# Patient Record
Sex: Female | Born: 1970 | Race: White | Hispanic: No | Marital: Single | Smoking: Current every day smoker
Health system: Southern US, Community
[De-identification: ages and names within clinical notes are randomized; demographics above are authoritative.]

## PROBLEM LIST (undated history)

## (undated) DIAGNOSIS — G43909 Migraine, unspecified, not intractable, without status migrainosus: Secondary | ICD-10-CM

## (undated) DIAGNOSIS — R569 Unspecified convulsions: Secondary | ICD-10-CM

## (undated) DIAGNOSIS — E7800 Pure hypercholesterolemia, unspecified: Secondary | ICD-10-CM

## (undated) DIAGNOSIS — F329 Major depressive disorder, single episode, unspecified: Secondary | ICD-10-CM

## (undated) DIAGNOSIS — C801 Malignant (primary) neoplasm, unspecified: Secondary | ICD-10-CM

## (undated) DIAGNOSIS — I10 Essential (primary) hypertension: Secondary | ICD-10-CM

## (undated) HISTORY — DX: Essential (primary) hypertension: I10

## (undated) HISTORY — DX: Major depressive disorder, single episode, unspecified: F32.9

## (undated) HISTORY — PX: SKIN CANCER EXCISION: SHX779

## (undated) HISTORY — DX: Migraine, unspecified, not intractable, without status migrainosus: G43.909

## (undated) HISTORY — DX: Pure hypercholesterolemia, unspecified: E78.00

## (undated) HISTORY — DX: Unspecified convulsions: R56.9

---

## 2015-02-14 ENCOUNTER — Emergency Department (HOSPITAL_COMMUNITY)
Admission: EM | Admit: 2015-02-14 | Discharge: 2015-02-14 | Disposition: A | Discharge status: Home / Self Care | Payer: Self-pay | Attending: Emergency Medicine | Admitting: Emergency Medicine

## 2015-02-14 ENCOUNTER — Encounter (HOSPITAL_COMMUNITY): Payer: Self-pay | Admitting: *Deleted

## 2015-02-14 DIAGNOSIS — Z85828 Personal history of other malignant neoplasm of skin: Secondary | ICD-10-CM | POA: Insufficient documentation

## 2015-02-14 DIAGNOSIS — M542 Cervicalgia: Secondary | ICD-10-CM | POA: Insufficient documentation

## 2015-02-14 DIAGNOSIS — R590 Localized enlarged lymph nodes: Secondary | ICD-10-CM | POA: Insufficient documentation

## 2015-02-14 DIAGNOSIS — R499 Unspecified voice and resonance disorder: Secondary | ICD-10-CM | POA: Insufficient documentation

## 2015-02-14 DIAGNOSIS — R630 Anorexia: Secondary | ICD-10-CM | POA: Insufficient documentation

## 2015-02-14 DIAGNOSIS — R591 Generalized enlarged lymph nodes: Secondary | ICD-10-CM

## 2015-02-14 DIAGNOSIS — G479 Sleep disorder, unspecified: Secondary | ICD-10-CM | POA: Insufficient documentation

## 2015-02-14 DIAGNOSIS — Z720 Tobacco use: Secondary | ICD-10-CM | POA: Insufficient documentation

## 2015-02-14 HISTORY — DX: Malignant (primary) neoplasm, unspecified: C80.1

## 2015-02-14 MED ORDER — HYDROCODONE-ACETAMINOPHEN 5-325 MG PO TABS
1.0000 | ORAL_TABLET | ORAL | Status: AC
  Administered 2015-02-14: 1 via ORAL
  Filled 2015-02-14: qty 1

## 2015-02-14 MED ORDER — AMOXICILLIN 500 MG PO CAPS: 500.0000 mg | ORAL_CAPSULE | Freq: Three times a day (TID) | ORAL | Status: DC

## 2015-02-14 MED ORDER — TRAMADOL HCL 50 MG PO TABS: 50.0000 mg | ORAL_TABLET | Freq: Four times a day (QID) | ORAL | Status: DC | PRN

## 2015-02-14 NOTE — ED Provider Notes (Signed)
CSN: [id number]     Arrival date & time 02/14/15  1235 History  This chart was scribed for Dorie Rank, MD by Rayna Sexton, ED scribe. This patient was seen in room APA07/[facility]07 and the patient's care was started at 12:52 PM.    Chief Complaint  Patient presents with  . Lymphadenopathy   The history is provided by the patient. No language interpreter was used.    HPI Comments: Lisa Park is a 44 y.o. female who presents to the Emergency Department complaining of mild, reoccurring, left-sided neck pain and swelling with onset 1 week ago. Pt notes associated trouble eating and sleeping, decreased ROM to her neck and a mild voice change. Pt notes the swelling is reoccuring with 1 prior incident about 2 months ago which ultimately subsided. She denies being on any medications, recent trauma, current medical issues or any known drug allergies. She denies any dental pain, HA, rash, fever, difficulty speaking, swallowing or breathing.    Past Medical History  Diagnosis Date  . Cancer     skin   History reviewed. No pertinent past surgical history. History reviewed. No pertinent family history. History  Substance Use Topics  . Smoking status: Current Every Day Smoker -- 1.00 packs/day    Types: Cigarettes  . Smokeless tobacco: Not on file  . Alcohol Use: No   OB History    No data available     Review of Systems  Constitutional: Positive for appetite change. Negative for fever and chills.  HENT: Positive for voice change. Negative for trouble swallowing.   Respiratory: Negative for shortness of breath.   Musculoskeletal: Positive for myalgias, neck pain and neck stiffness.  Skin: Negative for rash.  Neurological: Negative for headaches.  Psychiatric/Behavioral: Positive for sleep disturbance.  All other systems reviewed and are negative.     Allergies  Review of patient's allergies indicates no known allergies.  Home Medications   Prior to Admission medications    Medication Sig Start Date End Date Taking? Authorizing Provider  amoxicillin (AMOXIL) 500 MG capsule Take 1 capsule (500 mg total) by mouth 3 (three) times daily. 02/14/15   Dorie Rank, MD  traMADol (ULTRAM) 50 MG tablet Take 1 tablet (50 mg total) by mouth every 6 (six) hours as needed. 02/14/15   Dorie Rank, MD   BP 139/92 mmHg  Pulse 71  Temp(Src) 97.8 F (36.6 C) (Oral)  Resp 22  Ht 5\' 6"  (1.676 m)  Wt 120 lb (54.432 kg)  BMI 19.38 kg/m2  SpO2 99%  LMP 02/14/2015 Physical Exam  Constitutional: She appears well-developed and well-nourished. No distress.  HENT:  Head: Normocephalic and atraumatic.  Right Ear: External ear normal.  Left Ear: External ear normal.  Mouth/Throat: Oropharynx is clear and moist. No trismus in the jaw. No dental abscesses, uvula swelling or dental caries. No oropharyngeal exudate, posterior oropharyngeal edema, posterior oropharyngeal erythema or tonsillar abscesses.  Eyes: Conjunctivae are normal. Right eye exhibits no discharge. Left eye exhibits no discharge. No scleral icterus.  Neck: Neck supple. No tracheal deviation present.  Cardiovascular: Normal rate.   Pulmonary/Chest: Effort normal. No stridor. No respiratory distress.  Musculoskeletal: She exhibits no edema.  Lymphadenopathy:       Head (right side): No submandibular adenopathy present.       Head (left side): Submandibular adenopathy present.    She has cervical adenopathy.       Left cervical: Superficial cervical adenopathy present.  Neurological: She is alert. Cranial nerve deficit: no gross  deficits.  Skin: Skin is warm and dry. No rash noted.  Psychiatric: She has a normal mood and affect.  Nursing note and vitals reviewed.   ED Course  Procedures  DIAGNOSTIC STUDIES: Oxygen Saturation is 99% on RA, normal by my interpretation.    COORDINATION OF CARE: 12:57 PM Discussed treatment plan with pt at bedside and pt agreed to plan.  Labs Review Labs Reviewed - No data to  display  Imaging Review No results found.   EKG Interpretation None      MDM   Final diagnoses:  Lymphadenopathy of head and neck   No fever.  No meningismus.  Focal ttp submandibular /superior cervical .  Dc home with oral abx.  Ultram for pain.  Monitor for fever, worsening symptoms   I personally performed the services described in this documentation, which was scribed in my presence.  The recorded information has been reviewed and is accurate.    Dorie Rank, MD 02/14/15 1316

## 2015-02-14 NOTE — ED Notes (Signed)
Pt noticed swollen glands on left side of neck x 1 week, states area is painful. Denies difficulty speaking, swallowing, or breathing. Pt states her voice has changed.

## 2015-02-14 NOTE — ED Notes (Signed)
Pt made aware to return if symptoms worsen or if any life threatening symptoms occur.   

## 2015-02-14 NOTE — Discharge Instructions (Signed)
Swollen Lymph Nodes °The lymphatic system filters fluid from around cells. It is like a system of blood vessels. These channels carry lymph instead of blood. The lymphatic system is an important part of the immune (disease fighting) system. When people talk about "swollen glands in the neck," they are usually talking about swollen lymph nodes. The lymph nodes are like the little traps for infection. You and your caregiver may be able to feel lymph nodes, especially swollen nodes, in these common areas: the groin (inguinal area), armpits (axilla), and above the clavicle (supraclavicular). You may also feel them in the neck (cervical) and the back of the head just above the hairline (occipital). °Swollen glands occur when there is any condition in which the body responds with an allergic type of reaction. For instance, the glands in the neck can become swollen from insect bites or any type of minor infection on the head. These are very noticeable in children with only minor problems. Lymph nodes may also become swollen when there is a tumor or problem with the lymphatic system, such as Hodgkin's disease. °TREATMENT  °· Most swollen glands do not require treatment. They can be observed (watched) for a short period of time, if your caregiver feels it is necessary. Most of the time, observation is not necessary. °· Antibiotics (medicines that kill germs) may be prescribed by your caregiver. Your caregiver may prescribe these if he or she feels the swollen glands are due to a bacterial (germ) infection. Antibiotics are not used if the swollen glands are caused by a virus. °HOME CARE INSTRUCTIONS  °· Take medications as directed by your caregiver. Only take over-the-counter or prescription medicines for pain, discomfort, or fever as directed by your caregiver. °SEEK MEDICAL CARE IF:  °· If you begin to run a temperature greater than 102° F (38.9° C), or as your caregiver suggests. °MAKE SURE YOU:  °· Understand these  instructions. °· Will watch your condition. °· Will get help right away if you are not doing well or get worse. °Document Released: 07/23/2002 Document Revised: 10/25/2011 Document Reviewed: 08/02/2005 °ExitCare® Patient Information ©2015 [employer]. This information is not intended to replace advice given to you by your health care provider. Make sure you discuss any questions you have with your health care provider. ° °Lymphadenopathy °Lymphadenopathy means "disease of the lymph glands." But the term is usually used to describe swollen or enlarged lymph glands, also called lymph nodes. These are the bean-shaped organs found in many locations including the neck, underarm, and groin. Lymph glands are part of the immune system, which fights infections in your body. Lymphadenopathy can occur in just one area of the body, such as the neck, or it can be generalized, with lymph node enlargement in several areas. The nodes found in the neck are the most common sites of lymphadenopathy. °CAUSES °When your immune system responds to germs (such as viruses or bacteria ), infection-fighting cells and fluid build up. This causes the glands to grow in size. Usually, this is not something to worry about. Sometimes, the glands themselves can become infected and inflamed. This is called lymphadenitis. °Enlarged lymph nodes can be caused by many diseases: °· Bacterial disease, such as strep throat or a skin infection. °· Viral disease, such as a common cold. °· Other germs, such as Lyme disease, tuberculosis, or sexually transmitted diseases. °· Cancers, such as lymphoma (cancer of the lymphatic system) or leukemia (cancer of the white blood cells). °· Inflammatory diseases such as lupus   or rheumatoid arthritis. °· Reactions to medications. °Many of the diseases above are rare, but important. This is why you should see your caregiver if you have lymphadenopathy. °SYMPTOMS °· Swollen, enlarged lumps in the neck, back of the head,  or other locations. °· Tenderness. °· Warmth or redness of the skin over the lymph nodes. °· Fever. °DIAGNOSIS °Enlarged lymph nodes are often near the source of infection. They can help health care providers diagnose your illness. For instance: °· Swollen lymph nodes around the jaw might be caused by an infection in the mouth. °· Enlarged glands in the neck often signal a throat infection. °· Lymph nodes that are swollen in more than one area often indicate an illness caused by a virus. °Your caregiver will likely know what is causing your lymphadenopathy after listening to your history and examining you. Blood tests, x-rays, or other tests may be needed. If the cause of the enlarged lymph node cannot be found, and it does not go away by itself, then a biopsy may be needed. Your caregiver will discuss this with you. °TREATMENT °Treatment for your enlarged lymph nodes will depend on the cause. Many times the nodes will shrink to normal size by themselves, with no treatment. Antibiotics or other medicines may be needed for infection. Only take over-the-counter or prescription medicines for pain, discomfort, or fever as directed by your caregiver. °HOME CARE INSTRUCTIONS °Swollen lymph glands usually return to normal when the underlying medical condition goes away. If they persist, contact your health-care provider. He/she might prescribe antibiotics or other treatments, depending on the diagnosis. Take any medications exactly as prescribed. Keep any follow-up appointments made to check on the condition of your enlarged nodes. °SEEK MEDICAL CARE IF: °· Swelling lasts for more than two weeks. °· You have symptoms such as weight loss, night sweats, fatigue, or fever that does not go away. °· The lymph nodes are hard, seem fixed to the skin, or are growing rapidly. °· Skin over the lymph nodes is red and inflamed. This could mean there is an infection. °SEEK IMMEDIATE MEDICAL CARE IF: °· Fluid starts leaking from the  area of the enlarged lymph node. °· You develop a fever of 102° F (38.9° C) or greater. °· Severe pain develops (not necessarily at the site of a large lymph node). °· You develop chest pain or shortness of breath. °· You develop worsening abdominal pain. °MAKE SURE YOU: °· Understand these instructions. °· Will watch your condition. °· Will get help right away if you are not doing well or get worse. °Document Released: 05/11/2008 Document Revised: 12/17/2013 Document Reviewed: 05/11/2008 °ExitCare® Patient Information ©2015 [employer]. This information is not intended to replace advice given to you by your health care provider. Make sure you discuss any questions you have with your health care provider. ° °

## 2015-09-17 DIAGNOSIS — Z139 Encounter for screening, unspecified: Secondary | ICD-10-CM

## 2016-03-30 NOTE — Congregational Nurse Program (Signed)
Congregational Nurse Program Note  Date of Encounter: 09/17/2015  Past Medical History: Past Medical History:  Diagnosis Date  . Cancer    skin    Encounter Details:     CNP Questionnaire - 03/30/16 1248      Patient Demographics   Is this a new or existing patient? New   Patient is considered a/an Not Applicable   Race Caucasian/White     Patient Assistance   Location of Patient [place]   Uninsured Patient Yes   Food insecurities addressed Not Applicable   Transportation assistance No   Assistance securing medications No   Educational health offerings Not Applicable     Encounter Details   Was an Emergency Department visit averted? Yes   Does patient have a medical provider? No   Patient referred to [facility]   Was a mental health screening completed? (GAINS tool) No   Does patient have dental issues? Yes   Was a dental referral made? Yes   Does patient have vision issues? No   Does your patient have an abnormal blood pressure today? No   Since previous encounter, have you referred patient for abnormal blood pressure that resulted in a new diagnosis or medication change? No   Does your patient have an abnormal blood glucose today? No   Since previous encounter, have you referred patient for abnormal blood glucose that resulted in a new diagnosis or medication change? No   Was there a life-saving intervention made? Yes    Client 's complaint was abscessed tooth 3 days painful. Client was referred to the [facility].Appointment was made 09/18/2015 10:00. Tarri Fuller.

## 2016-03-30 NOTE — Congregational Nurse Program (Incomplete)
Congregational Nurse Program Note  Date of Encounter: 09/17/2015  Past Medical History: Past Medical History:  Diagnosis Date  . Cancer    skin    Encounter Details:     CNP Questionnaire - 03/30/16 1248      Patient Demographics   Is this a new or existing patient? New   Patient is considered a/an Not Applicable   Race Caucasian/White     Patient Assistance   Location of Patient [place]   Uninsured Patient Yes   Food insecurities addressed Not Applicable   Transportation assistance No   Assistance securing medications No   Educational health offerings Not Applicable     Encounter Details   Was an Emergency Department visit averted? Yes   Does patient have a medical provider? No   Patient referred to [facility]   Was a mental health screening completed? (GAINS tool) No   Does patient have dental issues? Yes   Was a dental referral made? Yes   Does patient have vision issues? No   Does your patient have an abnormal blood pressure today? No   Since previous encounter, have you referred patient for abnormal blood pressure that resulted in a new diagnosis or medication change? No   Does your patient have an abnormal blood glucose today? No   Since previous encounter, have you referred patient for abnormal blood glucose that resulted in a new diagnosis or medication change? No   Was there a life-saving intervention made? Yes     Seen for dental assistance due to abscess tooth, seen in [place], no insurance, referred to [place], [place] [phone]

## 2016-04-15 NOTE — Congregational Nurse Program (Unsigned)
Congregational Nurse Program Note  Date of Encounter: 09/17/2015  Past Medical History: Past Medical History:  Diagnosis Date   Cancer    skin    Encounter Details:     CNP Questionnaire - 03/30/16 1248      Patient Demographics   Is this a new or existing patient? --   Patient is considered a/an --   Race --     Patient Assistance   Location of Patient Assistance --   Uninsured Patient --   Food insecurities addressed --   Transportation assistance --   Assistance securing medications --   Educational health offerings --     Encounter Details   Was an Emergency Department visit averted? --   Does patient have a medical provider? --   Patient referred to --   Was a mental health screening completed? (GAINS tool) --   Does patient have dental issues? --   Was a dental referral made? --   Does patient have vision issues? --   Does your patient have an abnormal blood pressure today? --   Since previous encounter, have you referred patient for abnormal blood pressure that resulted in a new diagnosis or medication change? --   Does your patient have an abnormal blood glucose today? --   Since previous encounter, have you referred patient for abnormal blood glucose that resulted in a new diagnosis or medication change? --   Was there a life-saving intervention made? --     Painful abscessed tooth for 3 days  BP 126/89 Pulse 114.  Appointment secured for [facility] September 18, 2015 at 10:00am. Alta Corning, RN([facility]) [facility] [phone]) [phone]

## 2016-09-03 ENCOUNTER — Emergency Department (HOSPITAL_COMMUNITY)
Admission: EM | Admit: 2016-09-03 | Discharge: 2016-09-03 | Disposition: A | Discharge status: Home / Self Care | Payer: Self-pay | Attending: Emergency Medicine | Admitting: Emergency Medicine

## 2016-09-03 ENCOUNTER — Emergency Department (HOSPITAL_COMMUNITY): Payer: Self-pay

## 2016-09-03 ENCOUNTER — Encounter (HOSPITAL_COMMUNITY): Payer: Self-pay | Admitting: Emergency Medicine

## 2016-09-03 DIAGNOSIS — Z85820 Personal history of malignant melanoma of skin: Secondary | ICD-10-CM | POA: Insufficient documentation

## 2016-09-03 DIAGNOSIS — F17210 Nicotine dependence, cigarettes, uncomplicated: Secondary | ICD-10-CM | POA: Insufficient documentation

## 2016-09-03 DIAGNOSIS — Z791 Long term (current) use of non-steroidal anti-inflammatories (NSAID): Secondary | ICD-10-CM | POA: Insufficient documentation

## 2016-09-03 DIAGNOSIS — Z79899 Other long term (current) drug therapy: Secondary | ICD-10-CM | POA: Insufficient documentation

## 2016-09-03 DIAGNOSIS — J36 Peritonsillar abscess: Secondary | ICD-10-CM | POA: Insufficient documentation

## 2016-09-03 LAB — I-STAT CHEM 8, ED
BUN: 7 mg/dL (ref 6–20)
Calcium, Ion: 1.21 mmol/L (ref 1.15–1.40)
Chloride: 103 mmol/L (ref 101–111)
Creatinine, Ser: 0.8 mg/dL (ref 0.44–1.00)
Glucose, Bld: 108 mg/dL — ABNORMAL HIGH (ref 65–99)
HCT: 46 % (ref 36.0–46.0)
Hemoglobin: 15.6 g/dL — ABNORMAL HIGH (ref 12.0–15.0)
Potassium: 3.3 mmol/L — ABNORMAL LOW (ref 3.5–5.1)
Sodium: 140 mmol/L (ref 135–145)
TCO2: 23 mmol/L (ref 0–100)

## 2016-09-03 LAB — CBC WITH DIFFERENTIAL/PLATELET
Basophils Absolute: 0 10*3/uL (ref 0.0–0.1)
Basophils Relative: 0 %
Eosinophils Absolute: 0.3 10*3/uL (ref 0.0–0.7)
Eosinophils Relative: 2 %
HCT: 45.2 % (ref 36.0–46.0)
Hemoglobin: 15.1 g/dL — ABNORMAL HIGH (ref 12.0–15.0)
Lymphocytes Relative: 13 %
Lymphs Abs: 1.6 10*3/uL (ref 0.7–4.0)
MCH: 31.9 pg (ref 26.0–34.0)
MCHC: 33.4 g/dL (ref 30.0–36.0)
MCV: 95.6 fL (ref 78.0–100.0)
Monocytes Absolute: 1.4 10*3/uL — ABNORMAL HIGH (ref 0.1–1.0)
Monocytes Relative: 12 %
Neutro Abs: 8.6 10*3/uL — ABNORMAL HIGH (ref 1.7–7.7)
Neutrophils Relative %: 73 %
Platelets: 270 10*3/uL (ref 150–400)
RBC: 4.73 MIL/uL (ref 3.87–5.11)
RDW: 13.9 % (ref 11.5–15.5)
WBC: 11.9 10*3/uL — ABNORMAL HIGH (ref 4.0–10.5)

## 2016-09-03 LAB — BASIC METABOLIC PANEL
Anion gap: 10 (ref 5–15)
BUN: 8 mg/dL (ref 6–20)
CO2: 23 mmol/L (ref 22–32)
Calcium: 9 mg/dL (ref 8.9–10.3)
Chloride: 105 mmol/L (ref 101–111)
Creatinine, Ser: 0.74 mg/dL (ref 0.44–1.00)
GFR calc Af Amer: >60 mL/min (ref >60)
GFR calc non Af Amer: >60 mL/min (ref >60)
Glucose, Bld: 106 mg/dL — ABNORMAL HIGH (ref 65–99)
Potassium: 3.3 mmol/L — ABNORMAL LOW (ref 3.5–5.1)
Sodium: 138 mmol/L (ref 135–145)

## 2016-09-03 LAB — RAPID STREP SCREEN (MED CTR MEBANE ONLY): Streptococcus, Group A Screen (Direct): NEGATIVE

## 2016-09-03 MED ORDER — KETOROLAC TROMETHAMINE 30 MG/ML IJ SOLN
30.0000 mg | Freq: Once | INTRAMUSCULAR | Status: AC
  Administered 2016-09-03: 30 mg via INTRAVENOUS
  Filled 2016-09-03: qty 1

## 2016-09-03 MED ORDER — HYDROCODONE-ACETAMINOPHEN 7.5-325 MG/15ML PO SOLN: 15.0000 mL | Freq: Four times a day (QID) | ORAL | 0 refills | Status: AC | PRN

## 2016-09-03 MED ORDER — HYDROCODONE-ACETAMINOPHEN 5-325 MG PO TABS: 1.0000 | ORAL_TABLET | ORAL | 0 refills | Status: DC | PRN

## 2016-09-03 MED ORDER — SODIUM CHLORIDE 0.9 % IV BOLUS (SEPSIS)
2000.0000 mL | Freq: Once | INTRAVENOUS | Status: AC
  Administered 2016-09-03: 2000 mL via INTRAVENOUS

## 2016-09-03 MED ORDER — CLINDAMYCIN PHOSPHATE 600 MG/50ML IV SOLN
600.0000 mg | Freq: Once | INTRAVENOUS | Status: AC
  Administered 2016-09-03: 600 mg via INTRAVENOUS
  Filled 2016-09-03: qty 50

## 2016-09-03 MED ORDER — MORPHINE SULFATE (PF) 4 MG/ML IV SOLN
4.0000 mg | Freq: Once | INTRAVENOUS | Status: AC
  Administered 2016-09-03: 4 mg via INTRAVENOUS
  Filled 2016-09-03: qty 1

## 2016-09-03 MED ORDER — CLINDAMYCIN HCL 300 MG PO CAPS: 300.0000 mg | ORAL_CAPSULE | Freq: Three times a day (TID) | ORAL | 0 refills | Status: DC

## 2016-09-03 MED ORDER — DEXAMETHASONE SODIUM PHOSPHATE 10 MG/ML IJ SOLN
10.0000 mg | Freq: Once | INTRAMUSCULAR | Status: AC
  Administered 2016-09-03: 10 mg via INTRAVENOUS
  Filled 2016-09-03: qty 1

## 2016-09-03 MED ORDER — IOPAMIDOL (ISOVUE-300) INJECTION 61%
100.0000 mL | Freq: Once | INTRAVENOUS | Status: AC | PRN
  Administered 2016-09-03: 75 mL via INTRAVENOUS

## 2016-09-03 NOTE — Discharge Instructions (Signed)
Medications: Clindamycin, Hycet  Treatment: Take clindamycin 3 times daily for 1 week. Make sure to finish all of this medication. Take Hycet 4 times daily as needed for severe pain. Take ibuprofen every 8 hours as needed for mild to moderate pain. Make sure to drink plenty of fluids.  Follow-up: If your symptoms are not improving over the next 72 hours, please call Dr. Victorio Palm office for further evaluation and treatment. Please return to the emergency department if you develop any new or worsening symptoms.

## 2016-09-03 NOTE — ED Provider Notes (Signed)
[facility] DEPT Provider Note   CSN: [id number] Arrival date & time: 09/03/16  1115     History   Chief Complaint Chief Complaint  Patient presents with  . Sore Throat  . Cough    HPI Lisa Park is a 46 y.o. female who presents with a one-week history of sore throat. Patient reports her symptoms began one week ago when she was in [place]. Patient was seen at a hospital in [place] in tested for flu. She does not believe she was tested for strep. He was given an intramuscular antibiotic injection, but has not noted exactly which antibiotic. She was discharged home with ibuprofen, albuterol, and a cough suppressant. Patient denies cough at this time. She reports she has not been able to eat or swallow here and she has pain with opening her mouth. She has also had associated fevers and night sweats since symptom onset. She denies cough, chest pain, shortness of breath, abdominal pain, nausea, vomiting, urinary symptoms.  HPI  Past Medical History:  Diagnosis Date  . Cancer ([place])    skin    There are no active problems to display for this patient.   History reviewed. No pertinent surgical history.  OB History    No data available       Home Medications    Prior to Admission medications   Medication Sig Start Date End Date Taking? Authorizing Provider  albuterol (PROVENTIL HFA;VENTOLIN HFA) 108 (90 Base) MCG/ACT inhaler Inhale 1 puff into the lungs every 6 (six) hours as needed for wheezing or shortness of breath.   Yes Historical Provider, MD  guaiFENesin-codeine (ROBITUSSIN AC) 100-10 MG/5ML syrup Take 5 mLs by mouth 3 (three) times daily as needed for cough.   Yes Historical Provider, MD  ibuprofen (ADVIL,MOTRIN) 800 MG tablet Take 800 mg by mouth every 8 (eight) hours as needed for moderate pain.   Yes Historical Provider, MD  Omega-3 Fatty Acids (FISH OIL) 1000 MG CAPS Take 1 capsule by mouth daily.   Yes Historical Provider, MD  amoxicillin (AMOXIL) 500 MG capsule  Take 1 capsule (500 mg total) by mouth 3 (three) times daily. 02/14/15   Dorie Rank, MD  clindamycin (CLEOCIN) 300 MG capsule Take 1 capsule (300 mg total) by mouth 3 (three) times daily. 09/03/16   Frederica Kuster, PA-C  HYDROcodone-acetaminophen (HYCET) 7.5-325 mg/15 ml solution Take 15 mLs by mouth 4 (four) times daily as needed for moderate pain. 09/03/16 09/10/16  Frederica Kuster, PA-C  traMADol (ULTRAM) 50 MG tablet Take 1 tablet (50 mg total) by mouth every 6 (six) hours as needed. 02/14/15   Dorie Rank, MD    Family History No family history on file.  Social History Social History  Substance Use Topics  . Smoking status: Current Every Day Smoker    Packs/day: 1.00    Types: Cigarettes  . Smokeless tobacco: Former Systems developer    Types: Chew  . Alcohol use No     Allergies   Patient has no known allergies.   Review of Systems Review of Systems  Constitutional: Positive for chills and fever.  HENT: Positive for ear pain (R) and sore throat. Negative for facial swelling.   Respiratory: Negative for cough and shortness of breath.   Cardiovascular: Negative for chest pain.  Gastrointestinal: Negative for abdominal pain, nausea and vomiting.  Genitourinary: Negative for dysuria.  Musculoskeletal: Negative for back pain.  Skin: Negative for rash and wound.  Neurological: Negative for headaches.  Psychiatric/Behavioral: The patient is  not nervous/anxious.      Physical Exam Updated Vital Signs BP 116/73 (BP Location: Left Arm)   Pulse 94   Temp 98.2 F (36.8 C) (Oral)   Resp 18   Ht 5\' 6"  (1.676 m)   Wt 53.5 kg   LMP 09/03/2016   SpO2 98%   BMI 19.05 kg/m   Physical Exam  Constitutional: She appears well-developed and well-nourished. No distress.  HENT:  Head: Normocephalic and atraumatic.  Mouth/Throat: There is trismus in the jaw. No uvula swelling. Oropharyngeal exudate, posterior oropharyngeal edema, posterior oropharyngeal erythema and tonsillar abscesses present.  Tonsils are 3+ on the right. Tonsils are 0 on the left. Tonsillar exudate.  Eyes: Conjunctivae are normal. Pupils are equal, round, and reactive to light. Right eye exhibits no discharge. Left eye exhibits no discharge. No scleral icterus.  Neck: Normal range of motion. Neck supple. No thyromegaly present.  Cardiovascular: Normal rate, regular rhythm, normal heart sounds and intact distal pulses.  Exam reveals no gallop and no friction rub.   No murmur heard. Pulmonary/Chest: Effort normal and breath sounds normal. No stridor. No respiratory distress. She has no wheezes. She has no rales.  Abdominal: Soft. Bowel sounds are normal. She exhibits no distension. There is no tenderness. There is no rebound and no guarding.  Musculoskeletal: She exhibits no edema.  Lymphadenopathy:    She has no cervical adenopathy.  Neurological: She is alert. Coordination normal.  Skin: Skin is warm and dry. No rash noted. She is not diaphoretic. No pallor.  Psychiatric: She has a normal mood and affect.  Nursing note and vitals reviewed.    ED Treatments / Results  Labs (all labs ordered are listed, but only abnormal results are displayed) Labs Reviewed  BASIC METABOLIC PANEL - Abnormal; Notable for the following:       Result Value   Potassium 3.3 (*)    Glucose, Bld 106 (*)    All other components within normal limits  CBC WITH DIFFERENTIAL/PLATELET - Abnormal; Notable for the following:    WBC 11.9 (*)    Hemoglobin 15.1 (*)    Neutro Abs 8.6 (*)    Monocytes Absolute 1.4 (*)    All other components within normal limits  I-STAT CHEM 8, ED - Abnormal; Notable for the following:    Potassium 3.3 (*)    Glucose, Bld 108 (*)    Hemoglobin 15.6 (*)    All other components within normal limits  RAPID STREP SCREEN (NOT AT [facility])  CULTURE, GROUP A STREP ([place])  I-STAT CREATININE, ED    EKG  EKG Interpretation None       Radiology Ct Soft Tissue Neck W Contrast  Result Date:  09/03/2016 CLINICAL DATA:  Sore throat and fever. Asymmetry right tonsil. Trismus. EXAM: CT NECK WITH CONTRAST TECHNIQUE: Multidetector CT imaging of the neck was performed using the standard protocol following the bolus administration of intravenous contrast. CONTRAST:  1mL ISOVUE-300 IOPAMIDOL (ISOVUE-300) INJECTION 61% COMPARISON:  None. FINDINGS: Pharynx and larynx: Diffuse enlargement of the adenoid tissue. Small low-density areas in the adenoids centrally and to the left may be microabscesses. Right peritonsillar abscess extends toward the adenoid on the right. 11 x 17 mm fluid collection in the right tonsil compatible with abscess. This is displacing the pharynx to the left. Diffuse soft tissue swelling of the right tonsil and hypopharynx on the right. No airway compromise. Normal vocal cords. Salivary glands: Parotid and submandibular glands normal without edema or stone. Thyroid: Negative Lymph  nodes: Mild prominence of posterior lymph nodes bilaterally. Right level 5 lymph node 8 mm. Left posterior lymph node 7 mm. These are likely reactive due to pharyngitis. Vascular: Carotid artery and jugular vein patent bilaterally. 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 No acute intracranial abnormality. Visualized orbits: Negative Mastoids and visualized paranasal sinuses: Air-fluid level left maxillary sinus. Mild mucosal edema right maxillary sinus. Mastoid sinus clear bilaterally. Skeleton: No acute skeletal abnormality. Poor dentition with multiple periapical lucencies. Upper chest: Mild scarring in the lung apices. Negative for infiltrate or effusion Other: None IMPRESSION: Right peritonsillar abscess 11 x 17 mm. This extends cranially towards the right adenoid. Both adenoids are also enlarged with additional small fluid collections. Soft tissue edema of the right tonsil extending into the right hypopharynx. No airway compromise. Mild reactive adenopathy in the neck.  Air-fluid level left maxillary sinus. Poor dentition. Electronically Signed   By: Franchot Gallo M.D.   On: 09/03/2016 16:46    Procedures Procedures (including critical care time)  Medications Ordered in ED Medications  sodium chloride 0.9 % bolus 2,000 mL (not administered)  clindamycin (CLEOCIN) IVPB 600 mg (not administered)  morphine 4 MG/ML injection 4 mg (not administered)  ketorolac (TORADOL) 30 MG/ML injection 30 mg (30 mg Intravenous Given 09/03/16 1537)  dexamethasone (DECADRON) injection 10 mg (10 mg Intravenous Given 09/03/16 1537)  iopamidol (ISOVUE-300) 61 % injection 100 mL (75 mLs Intravenous Contrast Given 09/03/16 1608)     Initial Impression / Assessment and Plan / ED Course  I have reviewed the triage vital signs and the nursing notes.  Pertinent labs & imaging results that were available during my care of the patient were reviewed by me and considered in my medical decision making (see chart for details).     Patient with right peritonsillar abscess. CBC shows WBC 11.9. BMP shows potassium 3.3, glucose 106. Rapid strep negative. Culture sent. CT neck soft tissue shows right peritonsillar abscess 11 x 17 mm extending towards right adenoid; both adenoids are also enlarged with additional small fluid collections; soft tissue edema of the right tonsil extending into the right hypopharynx; no airway compromise; mild reactive adenopathy in the neck; air-fluid level left maxillary sinus. I spoke with ENT on call, Dr. Wilburn Cornelia, who advised 3L IV fluids, Decadron, clindamycin with discharge home. Discharged home with clindamycin, Hycet, and extensive fluid intake. Dr. Wilburn Cornelia advised patient to follow-up if no better or worsening in 72 hours. Patient understands and agrees with plan. Return precautions discussed.  At shift change, patient care transferred to [facility], PA-C for continued evaluation, follow up of fluids and IV clindamycin. Discharge home following  treatment.    Final Clinical Impressions(s) / ED Diagnoses   Final diagnoses:  Peritonsillar abscess    New Prescriptions New Prescriptions   CLINDAMYCIN (CLEOCIN) 300 MG CAPSULE    Take 1 capsule (300 mg total) by mouth 3 (three) times daily.   HYDROCODONE-ACETAMINOPHEN (HYCET) 7.5-325 MG/15 ML SOLUTION    Take 15 mLs by mouth 4 (four) times daily as needed for moderate pain.     Frederica Kuster, PA-C 09/03/16 [place], DO 09/05/16 1831

## 2016-09-03 NOTE — ED Triage Notes (Signed)
Patient complains of sore throat and cough x 1 week. States fever at home. NAD.

## 2016-09-06 LAB — CULTURE, GROUP A STREP (THRC)

## 2016-09-06 MED FILL — Hydrocodone-Acetaminophen Tab 5-325 MG: ORAL | Qty: 6 | Status: AC

## 2019-08-23 ENCOUNTER — Telehealth: Payer: Self-pay | Admitting: Diagnostic Neuroimaging

## 2019-08-23 NOTE — Telephone Encounter (Signed)
We received an urgent referral on pt from Dr. Lacy Duverney for new onset seizures. While scheduling her appointment, she mentioned a few things I wanted to run by you. According to her, they were down in [place] over the holidays, she had a few episodes while there, but didn't realize the severity and just wanted to wait until she got back home. On 1/4, while on their way back up to [place], she had another. She ended up being seen in the ED in [facility], which she does have those records on a disc. She mentioned they also tested her for covid, and it came back negative. Was not showing any sxs and still isn't. She doesn't believe there's any documentation of this negative test result on the disc. Right now, I have her scheduled for Monday, but wanted to check and see if this was ok?

## 2019-08-23 NOTE — Telephone Encounter (Signed)
Pls schedule for week of Sep 03, 2019. -VRP

## 2019-08-27 ENCOUNTER — Ambulatory Visit: Payer: 59 | Admitting: Diagnostic Neuroimaging

## 2019-08-29 ENCOUNTER — Encounter: Payer: Self-pay | Admitting: *Deleted

## 2019-09-03 ENCOUNTER — Encounter: Payer: Self-pay | Admitting: Diagnostic Neuroimaging

## 2019-09-03 ENCOUNTER — Ambulatory Visit (INDEPENDENT_AMBULATORY_CARE_PROVIDER_SITE_OTHER): Payer: 59 | Admitting: Diagnostic Neuroimaging

## 2019-09-03 ENCOUNTER — Other Ambulatory Visit: Payer: Self-pay

## 2019-09-03 VITALS — BP 126/86 | HR 79 | Temp 97.3°F | Ht 66.0 in | Wt 130.6 lb

## 2019-09-03 DIAGNOSIS — R4689 Other symptoms and signs involving appearance and behavior: Secondary | ICD-10-CM | POA: Diagnosis not present

## 2019-09-03 DIAGNOSIS — G43101 Migraine with aura, not intractable, with status migrainosus: Secondary | ICD-10-CM

## 2019-09-03 MED ORDER — RIZATRIPTAN BENZOATE 10 MG PO TBDP: 10.0000 mg | ORAL_TABLET | ORAL | 11 refills | Status: DC | PRN

## 2019-09-03 MED ORDER — AMITRIPTYLINE HCL 25 MG PO TABS: 25.0000 mg | ORAL_TABLET | Freq: Every day | ORAL | 3 refills | Status: DC

## 2019-09-03 NOTE — Patient Instructions (Signed)
ABNORMAL SPELL - check EEG  - continue levetiracetam 500mg  twice a day for now; may wean off if EEG normal  - follow up with PCP re: syncope workup  - According to [place] law, you can not drive unless you are seizure / syncope free for at least 6 months and under physician's care.   - Please maintain precautions. Do not participate in activities where a loss of awareness could harm you or someone else. No swimming alone, no tub bathing, no hot tubs, no driving, no operating motorized vehicles (cars, ATVs, motocycles, etc), lawnmowers, power tools or firearms. No standing at heights, such as rooftops, ladders or stairs. Avoid hot objects such as stoves, heaters, open fires. Wear a helmet when riding a bicycle, scooter, skateboard, etc. and avoid areas of traffic. Set your water heater to 120 degrees or less.    HEADACHES (migraine with aura; almost daily) - start amitriptyline 25mg  at bedtime (daily preventions) - start rizatriptan 10mg  as needed for migraine (rescue; max 2 per day or 8 per month)

## 2019-09-03 NOTE — Progress Notes (Signed)
[facility]  PATIENT: Lisa Park DOB: 1971-03-26  REFERRING CLINICIAN: Glenda Chroman, MD HISTORY FROM: patient and fiance REASON FOR VISIT: new consult    HISTORICAL  CHIEF COMPLAINT:  Chief Complaint  Patient presents with  . Seizures    rm 7 New Pt, [name] "new onset of seizures Dec 28 and 29th, I remember the first one; sister has grand mal seizures"    HISTORY OF PRESENT ILLNESS:   49 year old female here for evaluation of seizures.  August 13, 2019 patient was in [place] visiting with family, under high stress, when she had 3 episodes of transient convulsions and unresponsiveness.  Episodes lasted for 5 to 10 seconds.  She recovered quickly afterwards.  She felt hot before and after the events.  No tongue biting or incontinence.  No prolonged postictal confusion.  Patient was fully back to baseline within a few minutes.  08/15/19 patient was returning back to [place].  On the way in [place] patient had an episode in the car.  They went to local emergency room for evaluation.  Discharge paperwork mentions "nonepileptic seizures".  Patient was treated for headaches with Toradol shot and discharged home.  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  Patient returned to [facility] and saw PCP who started her empirically on levetiracetam 500 mg twice a day for the past 10 days.  Patient tolerating medication well.  No further events.  Patient also has long history of headaches since age 84 years old, with right-sided throbbing sensation, nausea, vomiting, sensitive to light and sound.  She sometimes sees prodromal spots and sparkles.  Nowadays she has headaches almost on a daily basis.  She mentions diagnosis of migraine headaches but has never been on migraine medications.  She has tried over-the-counter medication without relief.  Patient also is  chronic insomnia and anxiety issues.  She has tried some anxiety medicines from family members which seem to help but has not been evaluated or treated herself.  Patient reports history of head traumas and concussions from sports injuries and other issues earlier in life.   REVIEW OF SYSTEMS: Full 14 system review of systems performed and negative with exception of: As per HPI.  ALLERGIES: Allergies  Allergen Reactions  . Paroxetine Hcl Hives and Shortness Of Breath  . Tape Rash    HOME MEDICATIONS: Outpatient Medications Prior to Visit  Medication Sig Dispense Refill  . diazepam (VALIUM) 5 MG tablet Take 2.5-5 mg by mouth 2 (two) times daily as needed.    Marland Kitchen ibuprofen (ADVIL,MOTRIN) 800 MG tablet Take 800 mg by mouth every 8 (eight) hours as needed for moderate pain.    Marland Kitchen levETIRAcetam (KEPPRA) 500 MG tablet Take 500 mg by mouth 2 (two) times daily.    . Omega-3 Fatty Acids (FISH OIL) 1000 MG CAPS Take 1 capsule by mouth daily.    Marland Kitchen albuterol (PROVENTIL HFA;VENTOLIN HFA) 108 (90 Base) MCG/ACT inhaler Inhale 1 puff into the lungs every 6 (six) hours as needed for wheezing or shortness of breath.    Marland Kitchen amoxicillin (AMOXIL) 500 MG capsule Take 1 capsule (500 mg total) by mouth 3 (three) times daily. 21 capsule 0  . clindamycin (CLEOCIN) 300 MG capsule Take 1 capsule (300 mg total) by mouth 3 (three) times daily. 21 capsule 0  . guaiFENesin-codeine (ROBITUSSIN AC) 100-10 MG/5ML syrup Take 5 mLs by mouth 3 (three) times daily  as needed for cough.    Marland Kitchen HYDROcodone-acetaminophen (NORCO/VICODIN) 5-325 MG tablet Take 1-2 tablets by mouth every 4 (four) hours as needed. 6 tablet 0  . traMADol (ULTRAM) 50 MG tablet Take 1 tablet (50 mg total) by mouth every 6 (six) hours as needed. 15 tablet 0   No facility-administered medications prior to visit.    PAST MEDICAL HISTORY: Past Medical History:  Diagnosis Date  . Cancer ([place])    skin, squamous cell, right face  . Seizure ([place]) 12/28 & 29/2020      PAST SURGICAL HISTORY: Past Surgical History:  Procedure Laterality Date  . SKIN CANCER EXCISION      FAMILY HISTORY: Family History  Problem Relation Age of Onset  . Breast cancer Sister   . Seizures Sister        grand mal  . Other Maternal Uncle        brain tumor    SOCIAL HISTORY: Social History   Socioeconomic History  . Marital status: Single    Spouse name: Not on file  . Number of children: 3  . Years of education: Not on file  . Highest education level: High school graduate  Occupational History    Comment: NA  Tobacco Use  . Smoking status: Current Every Day Smoker    Packs/day: 1.00    Types: Cigarettes  . Smokeless tobacco: Former Systems developer    Types: Chew  Substance and Sexual Activity  . Alcohol use: Yes    Comment: occasional  . Drug use: No  . Sexual activity: Not on file  Other Topics Concern  . Not on file  Social History Narrative   Lives with fiancee   Caffeine- coffee 1 c daily, 1 Mtn Dew   Social Determinants of Health   Financial Resource Strain:   . Difficulty of Paying Living Expenses: Not on file  Food Insecurity:   . Worried About Charity fundraiser in the Last Year: Not on file  . Ran Out of Food in the Last Year: Not on file  Transportation Needs:   . Lack of Transportation (Medical): Not on file  . Lack of Transportation (Non-Medical): Not on file  Physical Activity:   . Days of Exercise per Week: Not on file  . Minutes of Exercise per Session: Not on file  Stress:   . Feeling of Stress : Not on file  Social Connections:   . Frequency of Communication with Friends and Family: Not on file  . Frequency of Social Gatherings with Friends and Family: Not on file  . Attends Religious Services: Not on file  . Active Member of Clubs or Organizations: Not on file  . Attends Archivist Meetings: Not on file  . Marital Status: Not on file  Intimate Partner Violence:   . Fear of Current or Ex-Partner: Not on file  .  Emotionally Abused: Not on file  . Physically Abused: Not on file  . Sexually Abused: Not on file     PHYSICAL EXAM  GENERAL EXAM/CONSTITUTIONAL: Vitals:  Vitals:   09/03/19 0916  BP: 126/86  Pulse: 79  Temp: (!) 97.3 F (36.3 C)  Weight: 130 lb 9.6 oz (59.2 kg)  Height: 5\' 6"  (1.676 m)     Body mass index is 21.08 kg/m. Wt Readings from Last 3 Encounters:  09/03/19 130 lb 9.6 oz (59.2 kg)  09/03/16 118 lb (53.5 kg)  02/14/15 120 lb (54.4 kg)     Patient is in no  distress; well developed, nourished and groomed; neck is supple  CARDIOVASCULAR:  Examination of carotid arteries is normal; no carotid bruits  Regular rate and rhythm, no murmurs  Examination of peripheral vascular system by observation and palpation is normal  EYES:  Ophthalmoscopic exam of optic discs and posterior segments is normal; no papilledema or hemorrhages  No exam data present  MUSCULOSKELETAL:  Gait, strength, tone, movements noted in Neurologic exam below  NEUROLOGIC: MENTAL STATUS:  No flowsheet data found.  awake, alert, oriented to person, place and time  recent and remote memory intact  normal attention and concentration  language fluent, comprehension intact, naming intact  fund of knowledge appropriate  CRANIAL NERVE:   2nd - no papilledema on fundoscopic exam  2nd, 3rd, 4th, 6th - pupils equal and reactive to light, visual fields full to confrontation, extraocular muscles intact, no nystagmus  5th - facial sensation symmetric  7th - facial strength symmetric  8th - hearing intact  9th - palate elevates symmetrically, uvula midline  11th - shoulder shrug symmetric  12th - tongue protrusion midline  MOTOR:   normal bulk and tone, full strength in the BUE, BLE  SENSORY:   normal and symmetric to light touch, temperature, vibration  COORDINATION:   finger-nose-finger, fine finger movements normal  REFLEXES:   deep tendon reflexes present and  symmetric  GAIT/STATION:   narrow based gait     DIAGNOSTIC DATA (LABS, IMAGING, TESTING) - I reviewed patient records, labs, notes, testing and imaging myself where available.  Lab Results  Component Value Date   WBC 11.9 (H) 09/03/2016   HGB 15.6 (H) 09/03/2016   HCT 46.0 09/03/2016   MCV 95.6 09/03/2016   PLT 270 09/03/2016      Component Value Date/Time   NA 140 09/03/2016 1419   K 3.3 (L) 09/03/2016 1419   CL 103 09/03/2016 1419   CO2 23 09/03/2016 1357   GLUCOSE 108 (H) 09/03/2016 1419   BUN 7 09/03/2016 1419   CREATININE 0.80 09/03/2016 1419   CALCIUM 9.0 09/03/2016 1357   GFRNONAA >60 09/03/2016 1357   GFRAA >60 09/03/2016 1357   No results found for: CHOL, HDL, LDLCALC, LDLDIRECT, TRIG, CHOLHDL No results found for: HGBA1C No results found for: VITAMINB12 No results found for: TSH   08/15/19 MRI brain [I reviewed images myself. 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 -VRP]     ASSESSMENT AND PLAN  49 y.o. year old female here with new onset episodes of loss of consciousness and involuntary movements, likely representing convulsive syncope.  Seizures possible but less likely.   Ddx: seizure vs syncope vs non-epileptic spell  1. Spell of abnormal behavior   2. Migraine with aura and with status migrainosus, not intractable     PLAN:  ABNORMAL SPELL - check EEG - continue levetiracetam 500mg  twice a day for now; we may wean patient off in future if EEG normal - follow up with PCP re: syncope workup - According to [place] law, you can not drive unless you are seizure / syncope free for at least 6 months and under physician's care - Please maintain precautions. Do not participate in activities where a loss of awareness could harm you or someone else. No swimming alone, no tub bathing, no hot tubs, no driving, no operating motorized vehicles (cars, ATVs, motocycles, etc), lawnmowers, power tools or firearms. No standing at  heights, such as rooftops, ladders or stairs. Avoid hot objects such as stoves, heaters,  open fires. Wear a helmet when riding a bicycle, scooter, skateboard, etc. and avoid areas of traffic. Set your water heater to 120 degrees or less.   HEADACHES (migraine with aura; almost daily) - start amitriptyline + rizatriptan   ANXIETY / INSOMNIA - follow up with PCP; consider psychology / psychiatry  Orders Placed This Encounter  Procedures  . [facility] EEG adult   Meds ordered this encounter  Medications  . amitriptyline (ELAVIL) 25 MG tablet    Sig: Take 1 tablet (25 mg total) by mouth at bedtime.    Dispense:  30 tablet    Refill:  3  . rizatriptan (MAXALT-MLT) 10 MG disintegrating tablet    Sig: Take 1 tablet (10 mg total) by mouth as needed for migraine. May repeat in 2 hours if needed    Dispense:  9 tablet    Refill:  11   Return in about 6 months (around 03/02/2020).  I reviewed images, labs, notes, records myself. I summarized findings and reviewed with patient, for this high risk condition (seizure vs syncope) requiring high complexity decision making.    Penni Bombard, MD [id number], [id number] AM Certified in Neurology, Neurophysiology and Neuroimaging  [facility] [address], [place], [place] [address] [phone]

## 2019-09-05 ENCOUNTER — Ambulatory Visit (INDEPENDENT_AMBULATORY_CARE_PROVIDER_SITE_OTHER): Payer: 59 | Admitting: Diagnostic Neuroimaging

## 2019-09-05 ENCOUNTER — Other Ambulatory Visit: Payer: 59

## 2019-09-05 DIAGNOSIS — R569 Unspecified convulsions: Secondary | ICD-10-CM

## 2019-09-05 DIAGNOSIS — R4689 Other symptoms and signs involving appearance and behavior: Secondary | ICD-10-CM

## 2019-09-13 ENCOUNTER — Telehealth: Payer: Self-pay | Admitting: Diagnostic Neuroimaging

## 2019-09-13 NOTE — Telephone Encounter (Signed)
Called patient and informed her that her EEG was normal. I advised she continue to be cautious with all activities. Patient verbalized understanding, appreciation.

## 2019-09-13 NOTE — Telephone Encounter (Signed)
Normal EEG. -VRP 

## 2019-09-13 NOTE — Telephone Encounter (Signed)
Called patient and informed her the EEG results aren't ready yet, however I will send this message to Dr Randel Pigg. She'll get a call as soon as results are available. I advised her she may get an eye exam. She stated she wasn't asking if she could get exam, was just giving information. She  verbalized understanding, appreciation.

## 2019-09-13 NOTE — Telephone Encounter (Signed)
Pt called wanting to know when she will be getting her EEG results. Also she would like to know if she is ok to get an eye exam today. Please advise.

## 2019-09-19 NOTE — Procedures (Signed)
   [facility]  EEG (ELECTROENCEPHALOGRAM) REPORT   STUDY DATE: 09/05/19 PATIENT NAME: Lisa Park DOB: 1971/01/28 MRN: [id number]  ORDERING CLINICIAN: Andrey Spearman, MD   TECHNOLOGIST: Babs Bertin TECHNIQUE: Electroencephalogram was recorded utilizing standard 10-20 system of lead placement and reformatted into average and bipolar montages.  RECORDING TIME: 26 minutes ACTIVATION: hyperventilation and photic stimulation  CLINICAL INFORMATION: 49 year old female with seizures.  FINDINGS: Posterior dominant background rhythms, which attenuate with eye opening, ranging 11-12 hertz and 40-50 microvolts. No focal, lateralizing, epileptiform activity or seizures are seen. Patient recorded in the awake and drowsy state. EKG channel shows regular rhythm of 90-95 beats per minute.   IMPRESSION:   Normal EEG in the awake and drowsy states.    INTERPRETING PHYSICIAN:  Penni Bombard, MD Certified in Neurology, Neurophysiology and Neuroimaging  [facility] [address] [place], [place] [address] [phone]

## 2019-12-13 ENCOUNTER — Other Ambulatory Visit: Payer: Self-pay | Admitting: Diagnostic Neuroimaging

## 2020-03-04 ENCOUNTER — Encounter: Payer: Self-pay | Admitting: Diagnostic Neuroimaging

## 2020-03-04 ENCOUNTER — Ambulatory Visit: Payer: 59 | Admitting: Diagnostic Neuroimaging

## 2020-03-04 ENCOUNTER — Telehealth: Payer: Self-pay | Admitting: *Deleted

## 2020-03-04 NOTE — Telephone Encounter (Signed)
Patient was no show for follow up today. 

## 2020-11-03 ENCOUNTER — Other Ambulatory Visit (HOSPITAL_COMMUNITY): Payer: Self-pay | Admitting: Internal Medicine

## 2020-11-03 DIAGNOSIS — R140 Abdominal distension (gaseous): Secondary | ICD-10-CM

## 2020-11-10 ENCOUNTER — Encounter (HOSPITAL_COMMUNITY): Payer: Self-pay

## 2020-11-10 ENCOUNTER — Ambulatory Visit (HOSPITAL_COMMUNITY): Payer: 59

## 2021-03-23 ENCOUNTER — Encounter (INDEPENDENT_AMBULATORY_CARE_PROVIDER_SITE_OTHER): Payer: Self-pay | Admitting: *Deleted

## 2021-04-29 ENCOUNTER — Other Ambulatory Visit: Payer: Self-pay | Admitting: Internal Medicine

## 2021-04-29 ENCOUNTER — Other Ambulatory Visit (HOSPITAL_COMMUNITY): Payer: Self-pay | Admitting: Internal Medicine

## 2021-04-29 DIAGNOSIS — G43809 Other migraine, not intractable, without status migrainosus: Secondary | ICD-10-CM

## 2021-04-30 ENCOUNTER — Other Ambulatory Visit (HOSPITAL_COMMUNITY): Payer: Self-pay | Admitting: Internal Medicine

## 2021-04-30 DIAGNOSIS — G43809 Other migraine, not intractable, without status migrainosus: Secondary | ICD-10-CM

## 2021-05-08 ENCOUNTER — Ambulatory Visit (HOSPITAL_COMMUNITY)
Admission: RE | Admit: 2021-05-08 | Discharge: 2021-05-08 | Disposition: A | Discharge status: Home / Self Care | Payer: 59 | Source: Ambulatory Visit | Attending: Internal Medicine | Admitting: Internal Medicine

## 2021-05-08 ENCOUNTER — Other Ambulatory Visit: Payer: Self-pay

## 2021-05-08 DIAGNOSIS — G43809 Other migraine, not intractable, without status migrainosus: Secondary | ICD-10-CM | POA: Insufficient documentation

## 2021-05-08 MED ORDER — GADOBUTROL 1 MMOL/ML IV SOLN
6.0000 mL | Freq: Once | INTRAVENOUS | Status: AC | PRN
  Administered 2021-05-08: 6 mL via INTRAVENOUS

## 2021-05-18 ENCOUNTER — Ambulatory Visit
Admission: RE | Admit: 2021-05-18 | Discharge: 2021-05-18 | Disposition: A | Discharge status: Home / Self Care | Payer: 59 | Source: Ambulatory Visit | Attending: Internal Medicine | Admitting: Internal Medicine

## 2021-05-18 ENCOUNTER — Other Ambulatory Visit: Payer: Self-pay

## 2021-05-18 ENCOUNTER — Other Ambulatory Visit: Payer: Self-pay | Admitting: Internal Medicine

## 2021-05-18 DIAGNOSIS — Z139 Encounter for screening, unspecified: Secondary | ICD-10-CM

## 2021-07-28 ENCOUNTER — Encounter: Payer: Self-pay | Admitting: *Deleted

## 2021-07-28 ENCOUNTER — Other Ambulatory Visit: Payer: Self-pay | Admitting: *Deleted

## 2021-07-31 ENCOUNTER — Institutional Professional Consult (permissible substitution): Payer: 59 | Admitting: Diagnostic Neuroimaging

## 2021-07-31 ENCOUNTER — Encounter: Payer: Self-pay | Admitting: Diagnostic Neuroimaging

## 2021-09-17 ENCOUNTER — Encounter (INDEPENDENT_AMBULATORY_CARE_PROVIDER_SITE_OTHER): Payer: Self-pay | Admitting: *Deleted

## 2021-10-07 ENCOUNTER — Encounter (INDEPENDENT_AMBULATORY_CARE_PROVIDER_SITE_OTHER): Payer: Self-pay | Admitting: *Deleted

## 2021-10-30 ENCOUNTER — Other Ambulatory Visit (INDEPENDENT_AMBULATORY_CARE_PROVIDER_SITE_OTHER): Payer: Self-pay

## 2021-10-30 ENCOUNTER — Encounter (INDEPENDENT_AMBULATORY_CARE_PROVIDER_SITE_OTHER): Payer: Self-pay

## 2021-10-30 ENCOUNTER — Telehealth (INDEPENDENT_AMBULATORY_CARE_PROVIDER_SITE_OTHER): Payer: Self-pay

## 2021-10-30 ENCOUNTER — Encounter (INDEPENDENT_AMBULATORY_CARE_PROVIDER_SITE_OTHER): Payer: Self-pay | Admitting: *Deleted

## 2021-10-30 DIAGNOSIS — Z1211 Encounter for screening for malignant neoplasm of colon: Secondary | ICD-10-CM

## 2021-10-30 MED ORDER — NA SULFATE-K SULFATE-MG SULF 17.5-3.13-1.6 GM/177ML PO SOLN: 1.0000 | Freq: Once | ORAL | 0 refills | Status: AC

## 2021-10-30 NOTE — Telephone Encounter (Signed)
Lisa Park Ann Millena Callins, CMA  ?

## 2021-10-30 NOTE — Telephone Encounter (Signed)
Referring MD/PCP: Woody Seller ? ?Procedure: Tcs ? ?Reason/Indication:  Screening ? ?Has patient had this procedure before?  no ? If so, when, by whom and where?   ? ?Is there a family history of colon cancer?  no ? Who?  What age when diagnosed?   ? ?Is patient diabetic? If yes, Type 1 or Type 2   no ?     ?Does patient have prosthetic heart valve or mechanical valve?  no ? ?Do you have a pacemaker/defibrillator?  no ? ?Has patient ever had endocarditis/atrial fibrillation? no ? ?Does patient use oxygen? no ? ?Has patient had joint replacement within last 12 months?  no ? ?Is patient constipated or do they take laxatives? no ? ?Does patient have a history of alcohol/drug use?  no ? ?Have you had a stroke/heart attack last 6 mths? no ? ?Do you take medicine for weight loss?  no ? ?For female patients,: have you had a hysterectomy no ?                     are you post menopausal yes  ?                     do you still have your menstrual cycle no ? ?Is patient on blood thinner such as Coumadin, Plavix and/or Aspirin? no ? ?Medications: metoprolol 25 mg daily, rosuvastatin 20 mg daily, lorazepam 1 mg tid ? ?Allergies: nkda ? ?Medication Adjustment per Dr Jenetta Downer none ? ?Procedure date & time: 11/25/21 at 1:30  ? ? ?

## 2021-11-25 ENCOUNTER — Encounter (HOSPITAL_COMMUNITY): Payer: Self-pay | Admitting: Gastroenterology

## 2021-11-25 ENCOUNTER — Other Ambulatory Visit: Payer: Self-pay

## 2021-11-25 ENCOUNTER — Ambulatory Visit (HOSPITAL_COMMUNITY): Payer: 59 | Admitting: Anesthesiology

## 2021-11-25 ENCOUNTER — Encounter (INDEPENDENT_AMBULATORY_CARE_PROVIDER_SITE_OTHER): Payer: Self-pay | Admitting: *Deleted

## 2021-11-25 ENCOUNTER — Encounter (HOSPITAL_COMMUNITY)
Admission: RE | Disposition: A | Discharge status: Home / Self Care | Payer: Self-pay | Source: Home / Self Care | Attending: Gastroenterology

## 2021-11-25 ENCOUNTER — Ambulatory Visit (HOSPITAL_COMMUNITY)
Admission: RE | Admit: 2021-11-25 | Discharge: 2021-11-25 | Disposition: A | Discharge status: Home / Self Care | Payer: 59 | Attending: Gastroenterology | Admitting: Gastroenterology

## 2021-11-25 ENCOUNTER — Ambulatory Visit (HOSPITAL_BASED_OUTPATIENT_CLINIC_OR_DEPARTMENT_OTHER): Payer: 59 | Admitting: Anesthesiology

## 2021-11-25 DIAGNOSIS — E7800 Pure hypercholesterolemia, unspecified: Secondary | ICD-10-CM | POA: Diagnosis not present

## 2021-11-25 DIAGNOSIS — D120 Benign neoplasm of cecum: Secondary | ICD-10-CM | POA: Insufficient documentation

## 2021-11-25 DIAGNOSIS — Z1211 Encounter for screening for malignant neoplasm of colon: Secondary | ICD-10-CM | POA: Insufficient documentation

## 2021-11-25 DIAGNOSIS — K635 Polyp of colon: Secondary | ICD-10-CM | POA: Diagnosis not present

## 2021-11-25 DIAGNOSIS — Z79899 Other long term (current) drug therapy: Secondary | ICD-10-CM | POA: Diagnosis not present

## 2021-11-25 DIAGNOSIS — I10 Essential (primary) hypertension: Secondary | ICD-10-CM | POA: Insufficient documentation

## 2021-11-25 DIAGNOSIS — F17200 Nicotine dependence, unspecified, uncomplicated: Secondary | ICD-10-CM | POA: Diagnosis not present

## 2021-11-25 DIAGNOSIS — R569 Unspecified convulsions: Secondary | ICD-10-CM | POA: Diagnosis not present

## 2021-11-25 HISTORY — PX: SUBMUCOSAL LIFTING INJECTION: SHX6855

## 2021-11-25 HISTORY — PX: POLYPECTOMY: SHX5525

## 2021-11-25 HISTORY — PX: COLONOSCOPY WITH PROPOFOL: SHX5780

## 2021-11-25 LAB — HM COLONOSCOPY

## 2021-11-25 SURGERY — COLONOSCOPY WITH PROPOFOL
Anesthesia: General

## 2021-11-25 MED ORDER — PROPOFOL 10 MG/ML IV BOLUS
INTRAVENOUS | Status: AC
  Filled 2021-11-25: qty 20

## 2021-11-25 MED ORDER — LACTATED RINGERS IV SOLN: INTRAVENOUS | Status: DC

## 2021-11-25 MED ORDER — PROPOFOL 500 MG/50ML IV EMUL
INTRAVENOUS | Status: DC | PRN
  Administered 2021-11-25: 150 ug/kg/min via INTRAVENOUS

## 2021-11-25 MED ORDER — PROPOFOL 10 MG/ML IV BOLUS
INTRAVENOUS | Status: DC | PRN
  Administered 2021-11-25 (×2): 50 mg via INTRAVENOUS

## 2021-11-25 MED ORDER — SODIUM CHLORIDE FLUSH 0.9 % IV SOLN
INTRAVENOUS | Status: AC
  Filled 2021-11-25: qty 10

## 2021-11-25 NOTE — Transfer of Care (Signed)
Immediate Anesthesia Transfer of Care Note ? ?Patient: Lisa Park ? ?Procedure(s) Performed: COLONOSCOPY WITH PROPOFOL ?POLYPECTOMY ?SUBMUCOSAL LIFTING INJECTION ? ?Patient Location: PACU ? ?Anesthesia Type:General ? ?Level of Consciousness: awake, alert , oriented and patient cooperative ? ?Airway & Oxygen Therapy: Patient Spontanous Breathing ? ?Post-op Assessment: Report given to RN, Post -op Vital signs reviewed and stable and Patient moving all extremities X 4 ? ?Post vital signs: Reviewed and stable ? ?Last Vitals:  ?Vitals Value Taken Time  ?BP    ?Temp    ?Pulse    ?Resp    ?SpO2    ? ? ?Last Pain:  ?Vitals:  ? 11/25/21 1421  ?TempSrc:   ?PainSc: 0-No pain  ?   ? ?Patients Stated Pain Goal: 6 (11/25/21 1216) ? ?Complications: No notable events documented. ?

## 2021-11-25 NOTE — Anesthesia Preprocedure Evaluation (Addendum)
Anesthesia Evaluation  ?Patient identified by MRN, date of birth, ID band ?Patient awake ? ? ? ?Reviewed: ?Allergy & Precautions, NPO status , Patient's Chart, lab work & pertinent test results ? ?Airway ?Mallampati: II ? ?TM Distance: >3 FB ?Neck ROM: Full ? ? ? Dental ? ?(+) Dental Advisory Given, Upper Dentures, Partial Lower ?  ?Pulmonary ?Current SmokerPatient did not abstain from smoking.,  ?  ?Pulmonary exam normal ?breath sounds clear to auscultation ? ? ? ? ? ? Cardiovascular ?Exercise Tolerance: Good ?hypertension, Pt. on medications ?Normal cardiovascular exam ?Rhythm:Regular Rate:Normal ? ? ?  ?Neuro/Psych ? Headaches, Seizures -, Well Controlled,  PSYCHIATRIC DISORDERS Depression   ? GI/Hepatic ?negative GI ROS, Neg liver ROS,   ?Endo/Other  ?negative endocrine ROS ? Renal/GU ?negative Renal ROS  ?negative genitourinary ?  ?Musculoskeletal ?negative musculoskeletal ROS ?(+)  ? Abdominal ?  ?Peds ?negative pediatric ROS ?(+)  Hematology ?negative hematology ROS ?(+)   ?Anesthesia Other Findings ? ? Reproductive/Obstetrics ?negative OB ROS ? ?  ? ? ? ? ? ? ? ? ? ? ? ? ? ?  ?  ? ? ? ? ? ? ? ?Anesthesia Physical ?Anesthesia Plan ? ?ASA: 2 ? ?Anesthesia Plan: General  ? ?Post-op Pain Management: Minimal or no pain anticipated  ? ?Induction: Intravenous ? ?PONV Risk Score and Plan: Propofol infusion ? ?Airway Management Planned: Nasal Cannula and Natural Airway ? ?Additional Equipment:  ? ?Intra-op Plan:  ? ?Post-operative Plan:  ? ?Informed Consent: I have reviewed the patients History and Physical, chart, labs and discussed the procedure including the risks, benefits and alternatives for the proposed anesthesia with the patient or authorized representative who has indicated his/her understanding and acceptance.  ? ? ? ?Dental advisory given ? ?Plan Discussed with: CRNA and Surgeon ? ?Anesthesia Plan Comments:   ? ? ? ? ? ? ?Anesthesia Quick Evaluation ? ?

## 2021-11-25 NOTE — Discharge Instructions (Signed)
You are being discharged to home.  Resume your previous diet.  We are waiting for your pathology results.  Your physician has recommended a repeat colonoscopy for surveillance based on pathology results.  

## 2021-11-25 NOTE — Op Note (Signed)
[facility] ?Patient Name: Lisa Park ?Procedure Date: 11/25/2021 2:09 PM ?MRN: [id number] ?Date of Birth: 06-Aug-1971 ?Attending MD: Maylon Peppers ,  ?CSN: [id number] ?Age: 51 ?Admit Type: Outpatient ?Procedure:                Colonoscopy ?Indications:              Screening for colorectal malignant neoplasm ?Providers:                Maylon Peppers, Caprice Kluver, Aram Candela ?Referring MD:              ?Medicines:                Monitored Anesthesia Care ?Complications:            No immediate complications. ?Estimated Blood Loss:     Estimated blood loss: none. ?Procedure:                Pre-Anesthesia Assessment: ?                          - Prior to the procedure, a History and Physical  ?                          was performed, and patient medications, allergies  ?                          and sensitivities were reviewed. The patient's  ?                          tolerance of previous anesthesia was reviewed. ?                          - The risks and benefits of the procedure and the  ?                          sedation options and risks were discussed with the  ?                          patient. All questions were answered and informed  ?                          consent was obtained. ?                          - ASA Grade Assessment: II - A patient with mild  ?                          systemic disease. ?                          After obtaining informed consent, the colonoscope  ?                          was passed under direct vision. Throughout the  ?                          procedure, the patient's blood pressure, pulse,  and  ?                          oxygen saturations were monitored continuously. The  ?                          PCF-HQ190L ([id number]) scope was introduced through  ?                          the anus and advanced to the the cecum, identified  ?                          by appendiceal orifice and ileocecal valve. The  ?                          colonoscopy was performed  without difficulty. The  ?                          patient tolerated the procedure well. The quality  ?                          of the bowel preparation was excellent. ?Scope In: 2:23:39 PM ?Scope Out: 2:43:40 PM ?Scope Withdrawal Time: 0 hours 16 minutes 50 seconds  ?Total Procedure Duration: 0 hours 20 minutes 1 second  ?Findings: ?     The perianal and digital rectal examinations were normal. ?     A 12 mm polyp was found in the cecum. The polyp was flat. Area was  ?     successfully injected with 8 mL Eleview for a lift polypectomy. Imaging  ?     was performed using white light and narrow band imaging to visualize the  ?     mucosa and demarcate the polyp site after injection for EMR purposes.  ?     The polyp was removed with a hot snare. Resection and retrieval were  ?     complete. ?     The retroflexed view of the distal rectum and anal verge was normal and  ?     showed no anal or rectal abnormalities. ?Impression:               - One 12 mm polyp in the cecum, removed with a hot  ?                          snare. Resected and retrieved. Injected. ?                          - The distal rectum and anal verge are normal on  ?                          retroflexion view. ?Moderate Sedation: ?     Per Anesthesia Care ?Recommendation:           - Discharge patient to home (ambulatory). ?                          - Resume previous diet. ?                          -  Await pathology results. ?                          - Repeat colonoscopy for surveillance based on  ?                          pathology results. ?Procedure Code(s):        --- Professional --- ?                          [id number], 70, Colonoscopy, flexible; with removal of  ?                          tumor(s), polyp(s), or other lesion(s) by snare  ?                          technique ?                          45381, Colonoscopy, flexible; with directed  ?                          submucosal injection(s), any substance ?Diagnosis Code(s):        ---  Professional --- ?                          Z12.11, Encounter for screening for malignant  ?                          neoplasm of colon ?                          K63.5, Polyp of colon ?CPT copyright 2019 American Medical Association. All rights reserved. ?The codes documented in this report are preliminary and upon coder review may  ?be revised to meet current compliance requirements. ?Maylon Peppers, MD ?Maylon Peppers,  ?11/25/2021 2:49:43 PM ?This report has been signed electronically. ?Number of Addenda: 0 ?

## 2021-11-25 NOTE — Anesthesia Postprocedure Evaluation (Signed)
Anesthesia Post Note ? ?Patient: Lisa Park ? ?Procedure(s) Performed: COLONOSCOPY WITH PROPOFOL ?POLYPECTOMY ?SUBMUCOSAL LIFTING INJECTION ? ?Patient location during evaluation: Endoscopy ?Anesthesia Type: General ?Level of consciousness: awake and alert and oriented ?Pain management: pain level controlled ?Vital Signs Assessment: post-procedure vital signs reviewed and stable ?Respiratory status: spontaneous breathing, nonlabored ventilation and respiratory function stable ?Cardiovascular status: blood pressure returned to baseline and stable ?Postop Assessment: no apparent nausea or vomiting ?Anesthetic complications: no ? ? ?No notable events documented. ? ? ?Last Vitals:  ?Vitals:  ? 11/25/21 1216 11/25/21 1450  ?BP: (!) 150/96 113/70  ?Pulse: 91 70  ?Resp: 13 14  ?Temp: 36.8 ?C 36.5 ?C  ?SpO2: 96% 97%  ?  ?Last Pain:  ?Vitals:  ? 11/25/21 1450  ?TempSrc: Oral  ?PainSc: 0-No pain  ? ? ?  ?  ?  ?  ?  ?  ? ?Tylyn Derwin C Caidin Heidenreich ? ? ? ? ?

## 2021-11-25 NOTE — H&P (Signed)
Lisa Park is an 51 y.o. female.   ?Chief Complaint: Screening colonoscopy ?HPI: 51 year old female with past medical history of hyperlipidemia, hypertension, depression and seizures, coming for screening colonoscopy. The patient has never had a colonoscopy in the past.  The patient denies having any complaints such as melena, hematochezia, abdominal pain or distention, change in her bowel movement consistency or frequency, no changes in her weight recently.  No family history of colorectal cancer. ? ? ?Past Medical History:  ?Diagnosis Date  ? Cancer [facility])   ? skin, squamous cell, right face  ? Essential hypertension   ? Hypercholesterolemia   ? MDD (major depressive disorder)   ? Migraine   ? Seizure ([place]) 12/28 & 29/2020  ? ? ?Past Surgical History:  ?Procedure Laterality Date  ? SKIN CANCER EXCISION    ? ? ?Family History  ?Problem Relation Age of Onset  ? Breast cancer Sister   ? Seizures Sister   ?     grand mal  ? Other Maternal Uncle   ?     brain tumor  ? ?Social History:  reports that she has been smoking cigarettes. She has been smoking an average of 1 pack per day. She has quit using smokeless tobacco.  Her smokeless tobacco use included chew. She reports current alcohol use. She reports that she does not use drugs. ? ?Allergies:  ?Allergies  ?Allergen Reactions  ? Paroxetine Hcl Hives and Shortness Of Breath  ? Tape Rash  ? ? ?Medications Prior to Admission  ?Medication Sig Dispense Refill  ? Biotin 5 MG CAPS Take 10 mg by mouth daily.    ? fluorouracil (EFUDEX) 5 % cream Apply 1 application. topically 2 (two) times daily.    ? ibuprofen (ADVIL) 200 MG tablet Take 400-600 mg by mouth every 6 (six) hours as needed for moderate pain.    ? LORazepam (ATIVAN) 1 MG tablet Take 1 mg by mouth 3 (three) times daily.    ? metoprolol succinate (TOPROL-XL) 25 MG 24 hr tablet Take 25 mg by mouth daily.    ? Omega-3 Fatty Acids (FISH OIL) 1000 MG CAPS Take 1 capsule by mouth daily.    ? rosuvastatin (CRESTOR) 20  MG tablet Take 20 mg by mouth daily.    ? vitamin E 180 MG (400 UNITS) capsule Take 400 Units by mouth daily.    ? SUMAtriptan (IMITREX) 50 MG tablet Take 50 mg by mouth every 2 (two) hours as needed for migraine. One at onset of headache, may repeat x 1 in 1 hr, do not exceed 200 mg in 24 hr    ? ? ?No results found for this or any previous visit (from the past 48 hour(s)). ?No results found. ? ?Review of Systems  ?Constitutional: Negative.   ?HENT: Negative.    ?Eyes: Negative.   ?Respiratory: Negative.    ?Cardiovascular: Negative.   ?Gastrointestinal: Negative.   ?Endocrine: Negative.   ?Genitourinary: Negative.   ?Musculoskeletal: Negative.   ?Skin: Negative.   ?Allergic/Immunologic: Negative.   ?Neurological: Negative.   ?Hematological: Negative.   ?Psychiatric/Behavioral: Negative.    ? ?Blood pressure (!) 150/96, pulse 91, temperature 98.2 ?F (36.8 ?C), temperature source Oral, resp. rate 13, height '5\' 6"'$  (1.676 m), weight 71.2 kg, SpO2 96 %. ?Physical Exam  ?GENERAL: The patient is AO x3, in no acute distress. ?HEENT: Head is normocephalic and atraumatic. EOMI are intact. Mouth is well hydrated and without lesions. ?NECK: Supple. No masses ?LUNGS: Clear to auscultation. No presence  of rhonchi/wheezing/rales. Adequate chest expansion ?HEART: RRR, normal s1 and s2. ?ABDOMEN: Soft, nontender, no guarding, no peritoneal signs, and nondistended. BS +. No masses. ?EXTREMITIES: Without any cyanosis, clubbing, rash, lesions or edema. ?NEUROLOGIC: AOx3, no focal motor deficit. ?SKIN: no jaundice, no rashes ? ?Assessment/Plan ?51 year old female with past medical history of hyperlipidemia, hypertension, depression and seizures, coming for screening colonoscopy.  The patient is at average risk for colorectal cancer.  We will proceed with colonoscopy today. ? ? ?Harvel Quale, MD ?11/25/2021, 12:40 PM ? ? ? ?

## 2021-11-26 NOTE — Addendum Note (Signed)
Addendum  created 11/26/21 1223 by Orlie Dakin, CRNA  ? Charge Capture section accepted, Intraprocedure Event edited, Intraprocedure Staff edited  ?  ?

## 2021-11-27 LAB — SURGICAL PATHOLOGY

## 2021-11-30 ENCOUNTER — Encounter (HOSPITAL_COMMUNITY): Payer: Self-pay | Admitting: Gastroenterology

## 2022-04-28 ENCOUNTER — Other Ambulatory Visit: Payer: Self-pay | Admitting: Internal Medicine

## 2022-04-28 DIAGNOSIS — Z1231 Encounter for screening mammogram for malignant neoplasm of breast: Secondary | ICD-10-CM

## 2022-05-26 ENCOUNTER — Inpatient Hospital Stay: Admission: RE | Admit: 2022-05-26 | Payer: 59 | Source: Ambulatory Visit

## 2022-08-06 IMAGING — MG MM DIGITAL SCREENING BILAT W/ TOMO AND CAD
8 series · 9 of 24 positions shown · non-contrast
Comparison: None.

CLINICAL DATA: Screening.

EXAM:
DIGITAL SCREENING BILATERAL MAMMOGRAM WITH TOMOSYNTHESIS AND CAD
TECHNIQUE: Bilateral screening digital craniocaudal and mediolateral oblique
mammograms were obtained. Bilateral screening digital breast
tomosynthesis was performed. The images were evaluated with
computer-aided detection.

[R CC synth-2D]
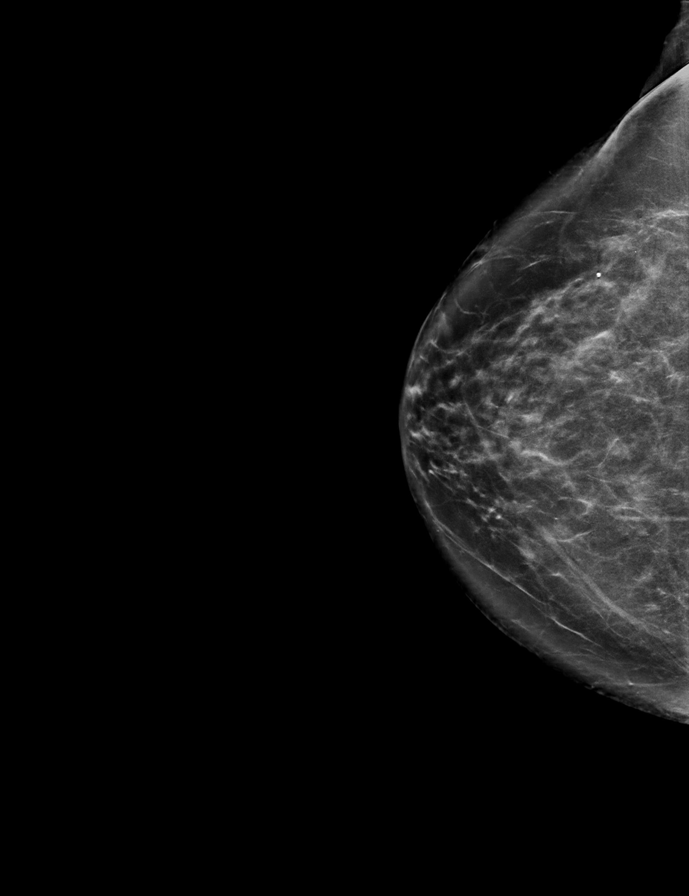

[R MLO synth-2D]
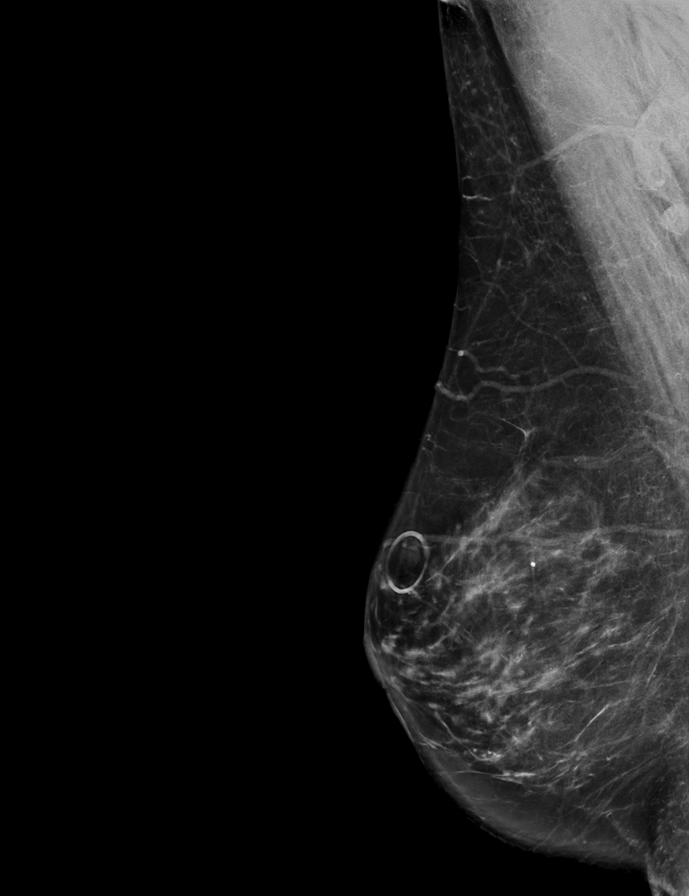

[L CC synth-2D]
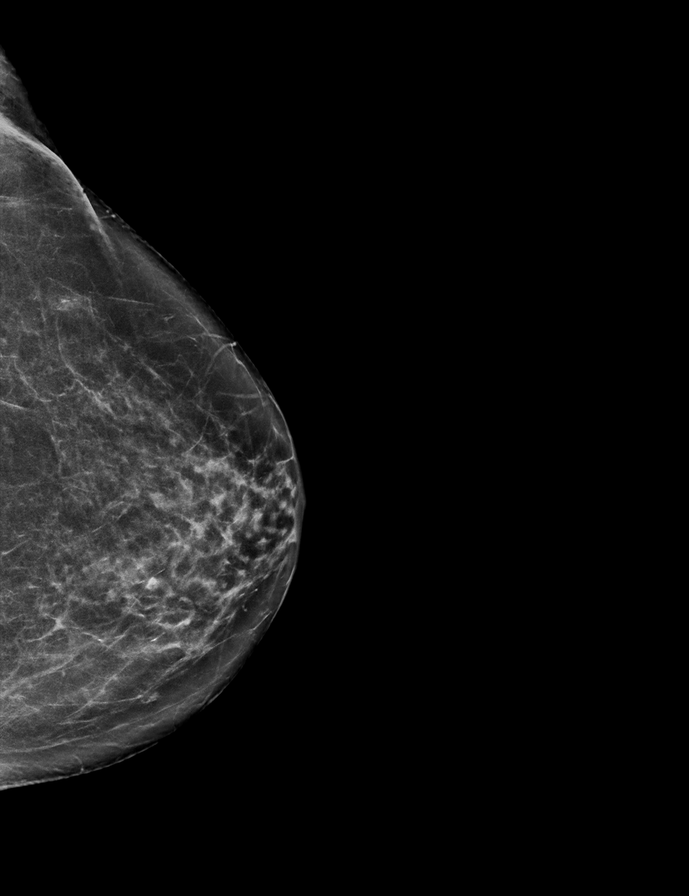

[L MLO synth-2D]
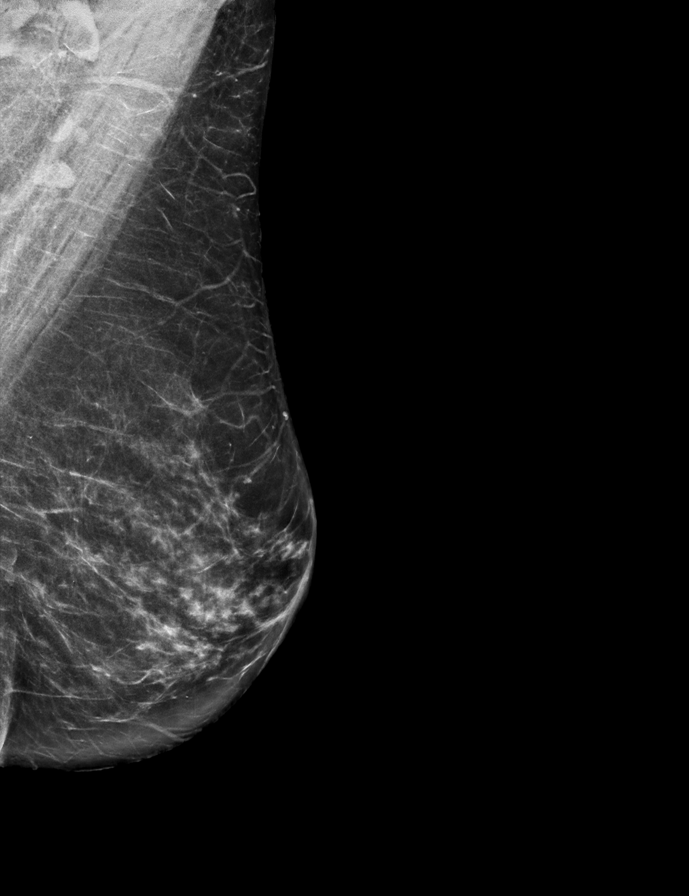

[R CC tomo · 2 of 72 frames shown]
[frame 24/72]
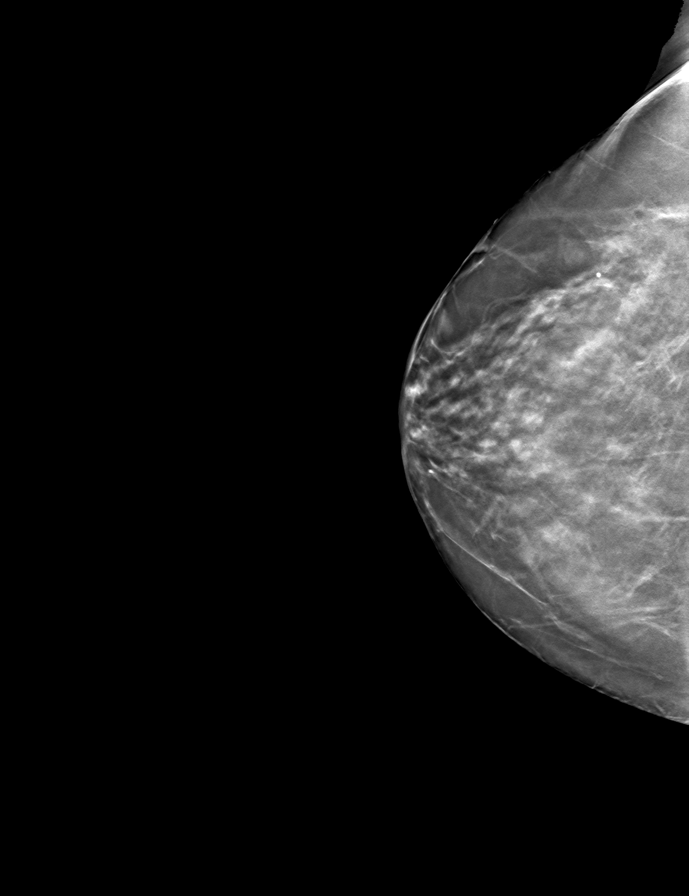
[frame 37/72]
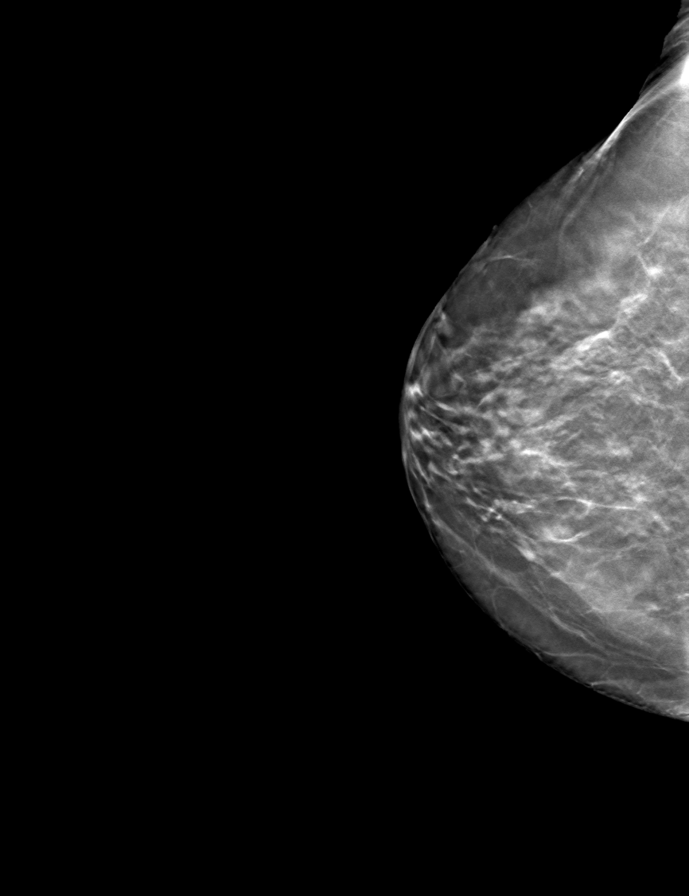

[L MLO tomo · tomo slice 35/68.0]
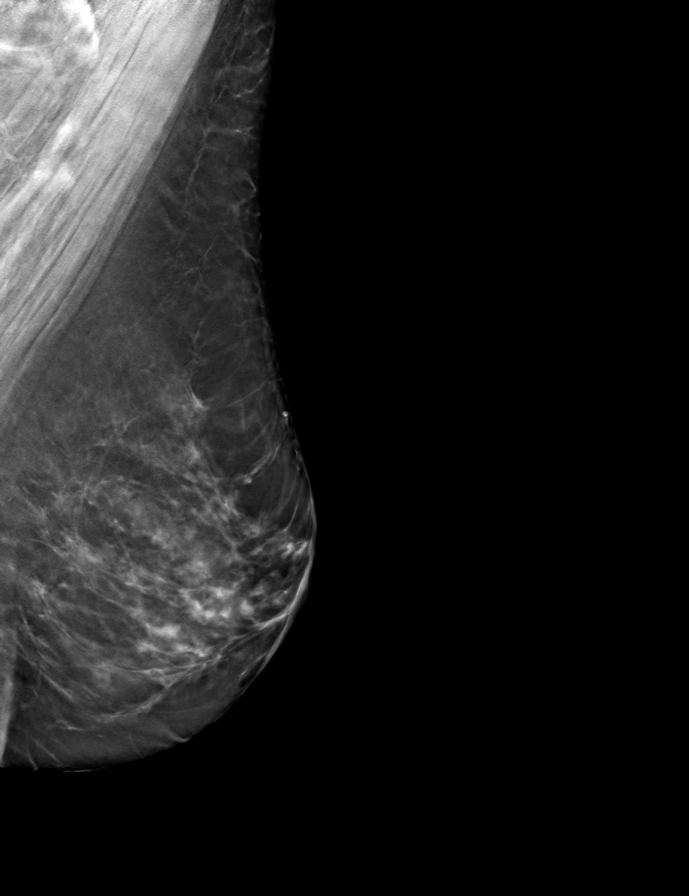

[L CC tomo · tomo slice 33/65.0]
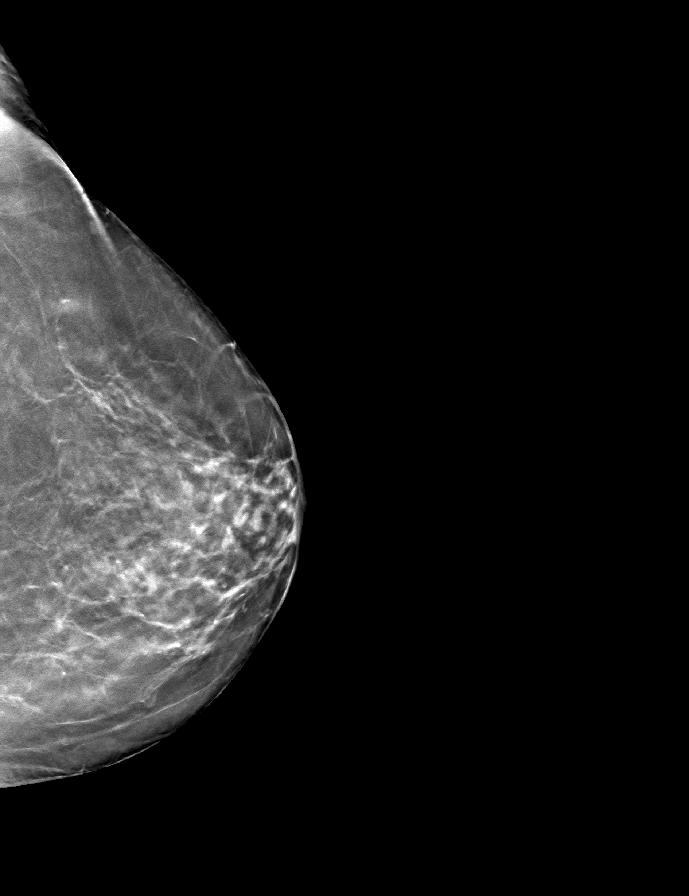

[R MLO tomo · tomo slice 39/76.0]
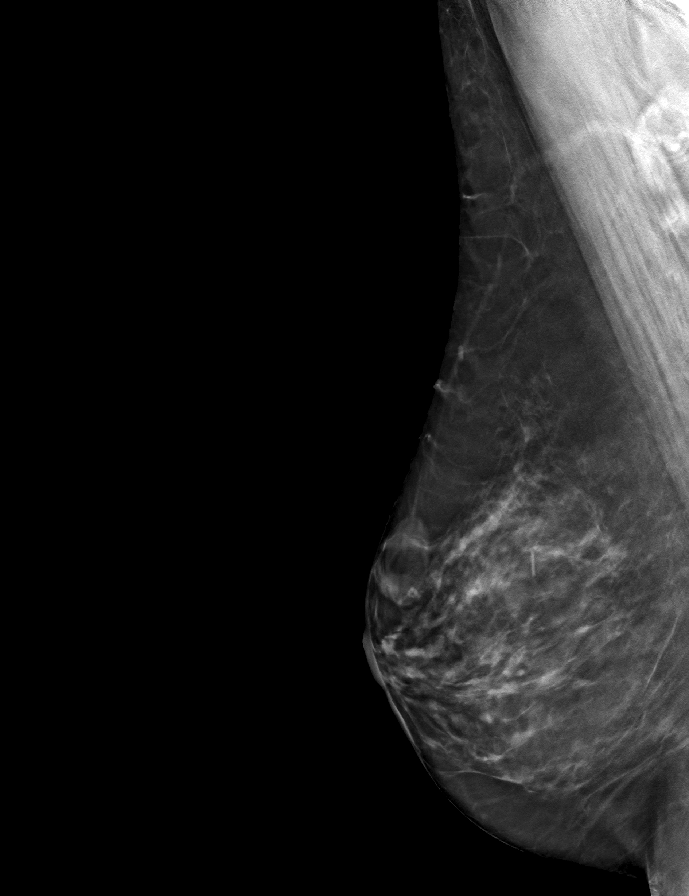

[9 of 24 positions shown; findings below may reference images not displayed]

ACR Breast Density Category b: There are scattered areas of
fibroglandular density.
FINDINGS: There are no findings suspicious for malignancy.
IMPRESSION: No mammographic evidence of malignancy. A result letter of this
screening mammogram will be mailed directly to the patient.

RECOMMENDATION:
Screening mammogram in one year. (Code:XG-X-X7B)

BI-RADS CATEGORY  1: Negative.

## 2022-08-23 DIAGNOSIS — M67439 Ganglion, unspecified wrist: Secondary | ICD-10-CM | POA: Diagnosis not present

## 2022-08-23 DIAGNOSIS — R69 Illness, unspecified: Secondary | ICD-10-CM | POA: Diagnosis not present

## 2022-09-08 DIAGNOSIS — M67432 Ganglion, left wrist: Secondary | ICD-10-CM | POA: Diagnosis not present

## 2022-09-08 DIAGNOSIS — M1812 Unilateral primary osteoarthritis of first carpometacarpal joint, left hand: Secondary | ICD-10-CM | POA: Diagnosis not present

## 2023-02-08 ENCOUNTER — Ambulatory Visit (INDEPENDENT_AMBULATORY_CARE_PROVIDER_SITE_OTHER): Payer: Medicaid Other | Admitting: Diagnostic Neuroimaging

## 2023-02-08 ENCOUNTER — Encounter: Payer: Self-pay | Admitting: Diagnostic Neuroimaging

## 2023-02-08 VITALS — BP 127/88 | HR 81 | Ht 66.0 in | Wt 150.0 lb

## 2023-02-08 DIAGNOSIS — R413 Other amnesia: Secondary | ICD-10-CM | POA: Diagnosis not present

## 2023-02-08 NOTE — Patient Instructions (Signed)
  MILD MEMORY IMPAIRMENT (subjective; MMSE 28/30) - likely related to long term anxiety and insomnia; no evidence of dementia - safety / supervision issues reviewed - daily physical activity / exercise (at least 15-30 minutes) - eat more plants / vegetables - increase social activities, brain stimulation, games, puzzles, hobbies, crafts, arts, music - aim for at least 7-8 hours sleep per night (or more) - avoid smoking and alcohol - caution with medications, finances, driving

## 2023-02-08 NOTE — Progress Notes (Signed)
[facility]  PATIENT: Lisa Park DOB: [birth year]  REFERRING CLINICIAN: Orlene Plum, NP HISTORY FROM: patient REASON FOR VISIT: new consult    HISTORICAL  CHIEF COMPLAINT:  Chief Complaint  Patient presents with   New Patient (Initial Visit)    Rm 6, here alone  Pt is coming in for memory concerns. States forgets things she needs to get, do or has seen.     HISTORY OF PRESENT ILLNESS:   UPDATE (02/08/23, VRP): Since last visit, more anxiety, panic attack, memory loss. Also her father passed away this year from alzheimers dementia, and she is concerned about her risk.   PRIOR HPI (09/03/19, VRP): 52 year old female here for evaluation of seizures.  August 13, 2019 patient was in [place] visiting with family, under high stress, when she had 3 episodes of transient convulsions and unresponsiveness.  Episodes lasted for 5 to 10 seconds.  She recovered quickly afterwards.  She felt hot before and after the events.  No tongue biting or incontinence.  No prolonged postictal confusion.  Patient was fully back to baseline within a few minutes.  08/15/19 patient was returning back to [place].  On the way in [place] patient had an episode in the car.  They went to local emergency room for evaluation.  Discharge paperwork mentions "nonepileptic seizures".  Patient was treated for headaches with Toradol shot and discharged home.  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  Patient returned to [facility] and saw PCP who started her empirically on levetiracetam 500 mg twice a day for the past 10 days.  Patient tolerating medication well.  No further events.  Patient also has long history of headaches since age 50 years old, with right-sided throbbing sensation, nausea, vomiting, sensitive to light and sound.  She sometimes sees prodromal spots and sparkles.   Nowadays she has headaches almost on a daily basis.  She mentions diagnosis of migraine headaches but has never been on migraine medications.  She has tried over-the-counter medication without relief.  Patient also is chronic insomnia and anxiety issues.  She has tried some anxiety medicines from family members which seem to help but has not been evaluated or treated herself.  Patient reports history of head traumas and concussions from sports injuries and other issues earlier in life.   REVIEW OF SYSTEMS: Full 14 system review of systems performed and negative with exception of: As per HPI.  ALLERGIES: Allergies  Allergen Reactions   Paroxetine Hcl Hives and Shortness Of Breath   Tape Rash    HOME MEDICATIONS: Outpatient Medications Prior to Visit  Medication Sig Dispense Refill   Biotin 5 MG CAPS Take 10 mg by mouth daily.     fluorouracil (EFUDEX) 5 % cream Apply 1 application. topically 2 (two) times daily.     ibuprofen (ADVIL) 200 MG tablet Take 400-600 mg by mouth every 6 (six) hours as needed for moderate pain.     LORazepam (ATIVAN) 1 MG tablet Take 1 mg by mouth 3 (three) times daily.     metoprolol succinate (TOPROL-XL) 25 MG 24 hr tablet Take 25 mg by mouth daily.     Omega-3 Fatty Acids (FISH OIL) 1000 MG CAPS Take 1 capsule by mouth daily.     rosuvastatin (CRESTOR) 20 MG tablet Take 20 mg by mouth daily.     SUMAtriptan (IMITREX) 50 MG tablet Take 50 mg by  mouth every 2 (two) hours as needed for migraine. One at onset of headache, may repeat x 1 in 1 hr, do not exceed 200 mg in 24 hr     vitamin E 180 MG (400 UNITS) capsule Take 400 Units by mouth daily.     No facility-administered medications prior to visit.    PAST MEDICAL HISTORY: Past Medical History:  Diagnosis Date   Cancer (HCC)    skin, squamous cell, right face   Essential hypertension    Hypercholesterolemia    MDD (major depressive disorder)    Migraine    Seizure (HCC) 12/28 & 29/2020    PAST  SURGICAL HISTORY: Past Surgical History:  Procedure Laterality Date   COLONOSCOPY WITH PROPOFOL N/A 11/25/2021   Procedure: COLONOSCOPY WITH PROPOFOL;  Surgeon: Dolores Frame, MD;  [facility]: [facility];  Service: Gastroenterology;  Laterality: N/A;  130   POLYPECTOMY  11/25/2021   Procedure: POLYPECTOMY;  Surgeon: Marguerita Merles, Reuel Boom, MD;  Location: [facility];  Service: Gastroenterology;;   SKIN CANCER EXCISION     SUBMUCOSAL LIFTING INJECTION  11/25/2021   Procedure: SUBMUCOSAL LIFTING INJECTION;  Surgeon: Dolores Frame, MD;  Location: [facility];  Service: Gastroenterology;;    FAMILY HISTORY: Family History  Problem Relation Age of Onset   Alzheimer's disease Father    Breast cancer Sister    Seizures Sister        grand mal   Other Maternal Uncle        brain tumor   Alzheimer's disease Paternal Grandmother    Dementia Paternal Grandfather    Alzheimer's disease Paternal Grandfather     SOCIAL HISTORY: Social History   Socioeconomic History   Marital status: Single    Spouse name: Not on file   Number of children: 3   Years of education: Not on file   Highest education level: High school graduate  Occupational History    Comment: NA  Tobacco Use   Smoking status: Every Day    Packs/day: 1    Types: Cigarettes   Smokeless tobacco: Former    Types: Chew  Substance and Sexual Activity   Alcohol use: Yes    Comment: occasional   Drug use: No   Sexual activity: Not on file  Other Topics Concern   Not on file  Social History Narrative   Lives with fiancee   Caffeine- coffee 1 c daily, 1 Mtn Dew   Social Determinants of Health   Financial Resource Strain: Not on file  Food Insecurity: Not on file  Transportation Needs: Not on file  Physical Activity: Not on file  Stress: Not on file  Social Connections: Not on file  Intimate Partner Violence: Not on file     PHYSICAL EXAM  GENERAL EXAM/CONSTITUTIONAL: Vitals:   Vitals:   02/08/23 1152  BP: 127/88  Pulse: 81  Weight: 150 lb (68 kg)  Height: 5\' 6"  (1.676 m)   Body mass index is 24.21 kg/m. Wt Readings from Last 3 Encounters:  02/08/23 150 lb (68 kg)  11/25/21 157 lb (71.2 kg)  09/03/19 130 lb 9.6 oz (59.2 kg)   Patient is in no distress; well developed, nourished and groomed; neck is supple  CARDIOVASCULAR: Examination of carotid arteries is normal; no carotid bruits Regular rate and rhythm, no murmurs Examination of peripheral vascular system by observation and palpation is normal  EYES: Ophthalmoscopic exam of optic discs and posterior segments is normal; no papilledema or hemorrhages No results  found.  MUSCULOSKELETAL: Gait, strength, tone, movements noted in Neurologic exam below  NEUROLOGIC: MENTAL STATUS:     02/08/2023   12:00 PM  MMSE - Mini Mental State Exam  Orientation to time 5  Orientation to Place 5  Registration 3  Attention/ Calculation 3  Recall 3  Language- name 2 objects 2  Language- repeat 1  Language- follow 3 step command 3  Language- read & follow direction 1  Write a sentence 1  Copy design 1  Total score 28   awake, alert, oriented to person, place and time recent and remote memory intact normal attention and concentration language fluent, comprehension intact, naming intact fund of knowledge appropriate  CRANIAL NERVE:  2nd - no papilledema on fundoscopic exam 2nd, 3rd, 4th, 6th - pupils equal and reactive to light, visual fields full to confrontation, extraocular muscles intact, no nystagmus 5th - facial sensation symmetric 7th - facial strength symmetric 8th - hearing intact 9th - palate elevates symmetrically, uvula midline 11th - shoulder shrug symmetric 12th - tongue protrusion midline  MOTOR:  normal bulk and tone, full strength in the BUE, BLE  SENSORY:  normal and symmetric to light touch, temperature, vibration  COORDINATION:  finger-nose-finger, fine finger movements  normal  REFLEXES:  deep tendon reflexes present and symmetric  GAIT/STATION:  narrow based gait     DIAGNOSTIC DATA (LABS, IMAGING, TESTING) - I reviewed patient records, labs, notes, testing and imaging myself where available.  Lab Results  Component Value Date   WBC 11.9 (H) 09/03/2016   HGB 15.6 (H) 09/03/2016   HCT 46.0 09/03/2016   MCV 95.6 09/03/2016   PLT 270 09/03/2016      Component Value Date/Time   NA 140 09/03/2016 1419   K 3.3 (L) 09/03/2016 1419   CL 103 09/03/2016 1419   CO2 23 09/03/2016 1357   GLUCOSE 108 (H) 09/03/2016 1419   BUN 7 09/03/2016 1419   CREATININE 0.80 09/03/2016 1419   CALCIUM 9.0 09/03/2016 1357   GFRNONAA >60 09/03/2016 1357   GFRAA >60 09/03/2016 1357   No results found for: "CHOL", "HDL", "LDLCALC", "LDLDIRECT", "TRIG", "CHOLHDL" No results found for: "HGBA1C" No results found for: "VITAMINB12" No results found for: "TSH"   08/15/19 MRI brain [I reviewed images myself. 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 -VRP]   09/05/19 EEG - normal  05/08/21 MRI brain / MRA head [I reviewed images myself and agree with interpretation. -VRP]  -No evidence of recent Infarction, hemorrhage, or mass. - Unremarkable MRA head. - No abnormal enhancement on post contrast imaging.    ASSESSMENT AND PLAN  52 y.o. year old female here with new onset episodes of loss of consciousness and involuntary movements, likely representing convulsive syncope.  Seizures possible but less likely.   Ddx: seizure vs syncope vs non-epileptic spell  No diagnosis found.   PLAN:  MILD MEMORY IMPAIRMENT (subjective; MMSE 28/30) - likely related to long term anxiety and insomnia; no evidence of dementia - safety / supervision issues reviewed - daily physical activity / exercise (at least 15-30 minutes) - eat more plants / vegetables - increase social activities, brain stimulation, games, puzzles, hobbies, crafts, arts,  music - aim for at least 7-8 hours sleep per night (or more) - avoid smoking and alcohol - caution with medications, finances, driving  HEADACHES (migraine with aura; 1-2 per week) - previously tried amitriptyline - sumatriptan as needed   ANXIETY / INSOMNIA - follow up with PCP;  consider psychology / psychiatry   Return for return to PCP, pending if symptoms worsen or fail to improve.     Suanne Marker, MD 02/08/2023, 12:33 PM Certified in Neurology, Neurophysiology and Neuroimaging  [facility] [address] [place], [place] [address] [phone]

## 2024-06-18 ENCOUNTER — Other Ambulatory Visit: Payer: Self-pay | Admitting: Internal Medicine

## 2024-06-18 DIAGNOSIS — Z1231 Encounter for screening mammogram for malignant neoplasm of breast: Secondary | ICD-10-CM

## 2024-06-19 ENCOUNTER — Ambulatory Visit
Admission: RE | Admit: 2024-06-19 | Discharge: 2024-06-19 | Disposition: A | Discharge status: Home / Self Care | Source: Ambulatory Visit | Attending: Internal Medicine | Admitting: Internal Medicine

## 2024-06-19 DIAGNOSIS — Z1231 Encounter for screening mammogram for malignant neoplasm of breast: Secondary | ICD-10-CM
